# Patient Record
Sex: Female | Born: 1990 | Race: White | Hispanic: No | Marital: Married | Smoking: Never smoker
Health system: Southern US, Community
[De-identification: ages and names within clinical notes are randomized; demographics above are authoritative.]

## PROBLEM LIST (undated history)

## (undated) DIAGNOSIS — R51 Headache: Secondary | ICD-10-CM

## (undated) DIAGNOSIS — I10 Essential (primary) hypertension: Secondary | ICD-10-CM

## (undated) DIAGNOSIS — K219 Gastro-esophageal reflux disease without esophagitis: Secondary | ICD-10-CM

## (undated) DIAGNOSIS — E785 Hyperlipidemia, unspecified: Secondary | ICD-10-CM

## (undated) DIAGNOSIS — R519 Headache, unspecified: Secondary | ICD-10-CM

---
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
## 2008-08-01 ENCOUNTER — Emergency Department: Payer: Self-pay | Admitting: Internal Medicine

## 2011-02-05 ENCOUNTER — Emergency Department: Payer: Self-pay | Admitting: Unknown Physician Specialty

## 2012-01-06 DIAGNOSIS — E785 Hyperlipidemia, unspecified: Secondary | ICD-10-CM | POA: Insufficient documentation

## 2012-02-04 DIAGNOSIS — F418 Other specified anxiety disorders: Secondary | ICD-10-CM | POA: Insufficient documentation

## 2012-02-04 HISTORY — DX: Other specified anxiety disorders: F41.8

## 2012-08-29 ENCOUNTER — Observation Stay: Payer: Self-pay | Admitting: Obstetrics & Gynecology

## 2012-08-29 LAB — URINALYSIS, COMPLETE
Glucose,UR: NEGATIVE mg/dL (ref 0–75)
Ketone: NEGATIVE
Nitrite: NEGATIVE
Ph: 7 (ref 4.5–8.0)
Protein: NEGATIVE
Specific Gravity: 1.005 (ref 1.003–1.030)
Squamous Epithelial: 33
WBC UR: 19 /HPF (ref 0–5)

## 2012-10-20 ENCOUNTER — Observation Stay: Payer: Self-pay

## 2012-10-22 ENCOUNTER — Observation Stay: Payer: Self-pay | Admitting: Obstetrics and Gynecology

## 2012-10-22 ENCOUNTER — Emergency Department: Payer: Self-pay | Admitting: Emergency Medicine

## 2012-10-22 LAB — COMPREHENSIVE METABOLIC PANEL
Anion Gap: 12 (ref 7–16)
Bilirubin,Total: 0.5 mg/dL (ref 0.2–1.0)
Calcium, Total: 8.7 mg/dL (ref 8.5–10.1)
Chloride: 106 mmol/L (ref 98–107)
Co2: 25 mmol/L (ref 21–32)
Creatinine: 0.5 mg/dL — ABNORMAL LOW (ref 0.60–1.30)
EGFR (African American): >60
EGFR (Non-African Amer.): >60
Osmolality: 280 (ref 275–301)
Potassium: 3.5 mmol/L (ref 3.5–5.1)
Sodium: 143 mmol/L (ref 136–145)

## 2012-10-22 LAB — CBC WITH DIFFERENTIAL/PLATELET
Basophil %: 0.2 %
Eosinophil #: 0.2 10*3/uL (ref 0.0–0.7)
HCT: 36.1 % (ref 35.0–47.0)
HGB: 12.7 g/dL (ref 12.0–16.0)
Lymphocyte #: 2.7 10*3/uL (ref 1.0–3.6)
Lymphocyte %: 13.2 %
MCH: 31.3 pg (ref 26.0–34.0)
MCHC: 35.1 g/dL (ref 32.0–36.0)
MCV: 89 fL (ref 80–100)
Monocyte #: 0.9 x10 3/mm (ref 0.2–0.9)
Neutrophil #: 16.4 10*3/uL — ABNORMAL HIGH (ref 1.4–6.5)
Neutrophil %: 81.2 %
RBC: 4.05 10*6/uL (ref 3.80–5.20)

## 2012-10-22 LAB — LIPASE, BLOOD: Lipase: 113 U/L (ref 73–393)

## 2012-10-22 LAB — PROTEIN / CREATININE RATIO, URINE: Protein, Random Urine: 11 mg/dL (ref 0–12)

## 2012-10-23 LAB — URINALYSIS, COMPLETE
Bilirubin,UR: NEGATIVE
Blood: NEGATIVE
Glucose,UR: NEGATIVE mg/dL (ref 0–75)
Ketone: NEGATIVE
Leukocyte Esterase: NEGATIVE
Ph: 8 (ref 4.5–8.0)
RBC,UR: 1 /HPF (ref 0–5)
Specific Gravity: 1.011 (ref 1.003–1.030)
Squamous Epithelial: 15
WBC UR: 3 /HPF (ref 0–5)

## 2012-11-20 ENCOUNTER — Inpatient Hospital Stay: Payer: Self-pay

## 2012-11-20 LAB — CBC WITH DIFFERENTIAL/PLATELET
Basophil #: 0 10*3/uL (ref 0.0–0.1)
Basophil %: 0.2 %
Eosinophil #: 0.1 10*3/uL (ref 0.0–0.7)
HCT: 36.8 % (ref 35.0–47.0)
HGB: 12.2 g/dL (ref 12.0–16.0)
Lymphocyte #: 3.3 10*3/uL (ref 1.0–3.6)
MCH: 29.9 pg (ref 26.0–34.0)
MCHC: 33.3 g/dL (ref 32.0–36.0)
MCV: 90 fL (ref 80–100)
Monocyte %: 6.3 %
Neutrophil #: 14.3 10*3/uL — ABNORMAL HIGH (ref 1.4–6.5)
Platelet: 322 10*3/uL (ref 150–440)
RDW: 13.1 % (ref 11.5–14.5)
WBC: 18.9 10*3/uL — ABNORMAL HIGH (ref 3.6–11.0)

## 2012-11-23 LAB — HEMATOCRIT: HCT: 34.7 % — ABNORMAL LOW (ref 35.0–47.0)

## 2013-02-15 ENCOUNTER — Ambulatory Visit: Payer: Self-pay | Admitting: Family Medicine

## 2013-03-25 DIAGNOSIS — L709 Acne, unspecified: Secondary | ICD-10-CM | POA: Insufficient documentation

## 2013-03-25 HISTORY — DX: Acne, unspecified: L70.9

## 2015-04-24 NOTE — H&P (Signed)
L&D Evaluation:  History:   HPI 24yo G2P1001 at 28wks presents with c/o lower abd and low back pain for the past 3 days.  Pain is worse with movement and repositioning in bed.  Better with rest.  Denies contractions/cramping, LOF, VB.  +FM.  [facility]    Patient's Medical History No Chronic Illness    Patient's Surgical History none    Medications Pre Natal Vitamins    Allergies NKDA    Social History none    Family History Non-Contributory   ROS:   ROS All systems were reviewed.  HEENT, CNS, GI, GU, Respiratory, CV, Renal and Musculoskeletal systems were found to be normal.   Exam:   Vital Signs stable    General no apparent distress    Mental Status clear    Chest nl effort    Abdomen gravid, non-tender    Estimated Fetal Weight Average for gestational age    Back no CVAT    Edema no edema    Pelvic no external lesions, cervix closed and thick    Mebranes Intact    FHT normal rate with no decels    Ucx absent    Other UA WNL   Impression:   Impression 28wks with discomforts of pregnancy and round ligament pain   Plan:   Plan discharge home, pt reassured    Follow Up Appointment tomorrow as previously scheduled   Electronic Signatures: Garnette GunnerStansbury Clipp, Ali LoweEryn K (MD)  (Signed 15-Sep-13 17:04)  Authored: L&D Evaluation   Last Updated: 15-Sep-13 17:04 by Garnette GunnerStansbury Clipp, Ali LoweEryn K (MD)

## 2015-04-24 NOTE — H&P (Signed)
L&D Evaluation:  History Expanded:   HPI 24 yo who presents with diarrhea and severe pain through to the back and tender to palpation in the midline. she is having some n/v and started 2 days ago and came here and got T3 and she was sent home, yesterday n/v and D and today she had pain around 4pm and she ate something and it got worse, and got bacd around 6 pm and she decieded to come in. no blood in n/v    Gravida 2    Term 1    PreTerm 0    Abortion 0    Living 1    Maternal HIV Negative    Maternal Syphilis Ab Nonreactive    Maternal Varicella Immune    Rubella Results (Maternal) equivocal    Maternal T-Dap Nonimmune    [facility]    Presents with abdominal pain, back pain, nausea/vomiting    Patient's Medical History No Chronic Illness    Patient's Surgical History none    Medications Pre Natal Vitamins    Allergies NKDA    Social History none    Family History Non-Contributory   ROS:   ROS All systems were reviewed.  HEENT, CNS, GI, GU, Respiratory, CV, Renal and Musculoskeletal systems were found to be normal.   Exam:   Vital Signs stable    Urine Protein not completed    General no apparent distress    Mental Status clear    Chest clear    Heart normal sinus rhythm    Abdomen gravid, non-tender    Back no CVAT    Pelvic no external lesions    Mebranes Intact    FHT normal rate with no decels, strictly reactive no decels    Ucx absent   Plan:   Comments pt is unable to stay still, she is in pain and is writhin, fetus looks good and she is probabl;y having pancreatitis or gall bladder atack, will start ivf and place NPO.    Follow Up Appointment need to schedule   Electronic Signatures: Adria DevonKlett, Shereen Marton (MD)  (Signed [phone]-Nov-13 21:44)  Authored: L&D Evaluation   Last Updated: 08-Nov-13 21:44 by Adria DevonKlett, Kymberlie Brazeau (MD)

## 2015-04-24 NOTE — H&P (Signed)
L&D Evaluation:  History Expanded:   HPI 24 yo G2P1 with EDD of 11/18/12, presents at 36 weeks with c/o RUQ and epigastric pain that has radiated from her back. Also reports occasional contractions. Denies LOF or VB. +FM. [facility]    Blood Type (Maternal) O positive    Group B Strep Results Maternal (Result >5wks must be treated as unknown) unknown/result > 5 weeks ago    Maternal HIV Negative    Maternal Syphilis Ab Nonreactive    Maternal Varicella Immune    Rubella Results (Maternal) equivocal    Patient's Medical History Anxiety    Patient's Surgical History none    Medications Pre Natal Vitamins    Allergies NKDA    Social History none    Family History Non-Contributory   ROS:   ROS All systems were reviewed.  HEENT, CNS, GI, GU, Respiratory, CV, Renal and Musculoskeletal systems were found to be normal.   Exam:   Vital Signs stable    General no apparent distress    Mental Status clear    Chest clear    Heart no murmur/gallop/rubs    Abdomen gravid, non-tender    Estimated Fetal Weight Average for gestational age    Back no CVAT    Edema no edema    Pelvic no external lesions, cervix closed and thick    Mebranes Intact    FHT normal rate with no decels    Ucx irregular   Impression:   Impression 36 weeks with abdominal pain   Plan:   Comments Given simethicone and Zantac without relief of symptoms Will try Tylenol #3 for pain. Suspect discomfort is r/t fetal position and discomforts of pregnancy    Follow Up Appointment already scheduled   Electronic Signatures: Vella KohlerBrothers, Avie Checo K (CNM)  (Signed 07-Nov-13 01:48)  Authored: L&D Evaluation   Last Updated: 07-Nov-13 01:48 by Vella KohlerBrothers, Dorlene Footman K (CNM)

## 2015-09-17 DIAGNOSIS — K219 Gastro-esophageal reflux disease without esophagitis: Secondary | ICD-10-CM | POA: Insufficient documentation

## 2015-12-20 ENCOUNTER — Encounter: Payer: Self-pay | Admitting: *Deleted

## 2015-12-21 ENCOUNTER — Ambulatory Visit: Payer: Medicaid Other | Admitting: Registered Nurse

## 2015-12-21 ENCOUNTER — Ambulatory Visit
Admission: RE | Admit: 2015-12-21 | Discharge: 2015-12-21 | Disposition: A | Discharge status: Home / Self Care | Payer: Medicaid Other | Source: Ambulatory Visit | Attending: Gastroenterology | Admitting: Gastroenterology

## 2015-12-21 ENCOUNTER — Encounter
Admission: RE | Disposition: A | Discharge status: Home / Self Care | Payer: Self-pay | Source: Ambulatory Visit | Attending: Gastroenterology

## 2015-12-21 DIAGNOSIS — E785 Hyperlipidemia, unspecified: Secondary | ICD-10-CM | POA: Insufficient documentation

## 2015-12-21 DIAGNOSIS — G43909 Migraine, unspecified, not intractable, without status migrainosus: Secondary | ICD-10-CM | POA: Diagnosis not present

## 2015-12-21 DIAGNOSIS — R1013 Epigastric pain: Secondary | ICD-10-CM | POA: Diagnosis present

## 2015-12-21 DIAGNOSIS — K2950 Unspecified chronic gastritis without bleeding: Secondary | ICD-10-CM | POA: Insufficient documentation

## 2015-12-21 DIAGNOSIS — K219 Gastro-esophageal reflux disease without esophagitis: Secondary | ICD-10-CM | POA: Insufficient documentation

## 2015-12-21 HISTORY — DX: Headache, unspecified: R51.9

## 2015-12-21 HISTORY — DX: Gastro-esophageal reflux disease without esophagitis: K21.9

## 2015-12-21 HISTORY — DX: Hyperlipidemia, unspecified: E78.5

## 2015-12-21 HISTORY — DX: Headache: R51

## 2015-12-21 HISTORY — PX: ESOPHAGOGASTRODUODENOSCOPY (EGD) WITH PROPOFOL: SHX5813

## 2015-12-21 SURGERY — ESOPHAGOGASTRODUODENOSCOPY (EGD) WITH PROPOFOL
Anesthesia: General

## 2015-12-21 MED ORDER — SODIUM CHLORIDE 0.9 % IV SOLN: INTRAVENOUS | Status: DC

## 2015-12-21 MED ORDER — FENTANYL CITRATE (PF) 100 MCG/2ML IJ SOLN
INTRAMUSCULAR | Status: DC | PRN
  Administered 2015-12-21: 25 ug via INTRAVENOUS
  Administered 2015-12-21: 50 ug via INTRAVENOUS

## 2015-12-21 MED ORDER — GLYCOPYRROLATE 0.2 MG/ML IJ SOLN
INTRAMUSCULAR | Status: DC | PRN
  Administered 2015-12-21: 0.2 mg via INTRAVENOUS

## 2015-12-21 MED ORDER — MIDAZOLAM HCL 2 MG/2ML IJ SOLN
INTRAMUSCULAR | Status: DC | PRN
  Administered 2015-12-21 (×2): 1 mg via INTRAVENOUS

## 2015-12-21 MED ORDER — PROPOFOL 500 MG/50ML IV EMUL
INTRAVENOUS | Status: DC | PRN
  Administered 2015-12-21: 180 ug/kg/min via INTRAVENOUS

## 2015-12-21 MED ORDER — SODIUM CHLORIDE 0.9 % IV SOLN
INTRAVENOUS | Status: DC
  Administered 2015-12-21: 09:00:00 via INTRAVENOUS

## 2015-12-21 MED ORDER — LIDOCAINE HCL (CARDIAC) 20 MG/ML IV SOLN
INTRAVENOUS | Status: DC | PRN
  Administered 2015-12-21: 40 mg via INTRAVENOUS

## 2015-12-21 MED ORDER — PROPOFOL 10 MG/ML IV BOLUS
INTRAVENOUS | Status: DC | PRN
  Administered 2015-12-21: 50 mg via INTRAVENOUS

## 2015-12-21 NOTE — Transfer of Care (Deleted)
Immediate Anesthesia Transfer of Care Note  Patient: Casey Hansen  Procedure(s) Performed: Procedure(s): ESOPHAGOGASTRODUODENOSCOPY (EGD) WITH PROPOFOL (N/A)  Patient Location: PACU and Endoscopy Unit  Anesthesia Type:General  Level of Consciousness: sedated  Airway & Oxygen Therapy: Patient Spontanous Breathing and Patient connected to nasal cannula oxygen  Post-op Assessment: Report given to RN and Post -op Vital signs reviewed and stable  Post vital signs: Reviewed and stable  Last Vitals:  Filed Vitals:   12/21/15 0940 12/21/15 0950  BP: 104/68 97/52  Pulse: 85 76  Temp: 36 C   Resp: 16 14    Complications: No apparent anesthesia complications

## 2015-12-21 NOTE — Anesthesia Preprocedure Evaluation (Signed)
Anesthesia Evaluation  Patient identified by MRN, date of birth, ID band Patient awake    Reviewed: Allergy & Precautions, NPO status , Patient's Chart, lab work & pertinent test results  Airway Mallampati: I       Dental no notable dental hx.    Pulmonary neg pulmonary ROS,    Pulmonary exam normal        Cardiovascular negative cardio ROS   Rhythm:Regular     Neuro/Psych    GI/Hepatic Neg liver ROS, GERD  ,  Endo/Other  negative endocrine ROS  Renal/GU negative Renal ROS     Musculoskeletal negative musculoskeletal ROS (+)   Abdominal Normal abdominal exam  (+)   Peds negative pediatric ROS (+)  Hematology negative hematology ROS (+)   Anesthesia Other Findings   Reproductive/Obstetrics                             Anesthesia Physical Anesthesia Plan  ASA: I  Anesthesia Plan: General   Post-op Pain Management:    Induction: Intravenous  Airway Management Planned: Nasal Cannula  Additional Equipment:   Intra-op Plan:   Post-operative Plan:   Informed Consent: I have reviewed the patients History and Physical, chart, labs and discussed the procedure including the risks, benefits and alternatives for the proposed anesthesia with the patient or authorized representative who has indicated his/her understanding and acceptance.     Plan Discussed with: CRNA  Anesthesia Plan Comments:         Anesthesia Quick Evaluation

## 2015-12-21 NOTE — Op Note (Signed)
[facility] Gastroenterology Patient Name: Casey Hansen Procedure Date: 12/21/2015 9:36 AM MRN: [id number] Account #: [id number] Date of Birth: May 01, 1991 Admit Type: Outpatient Age: 25 Room: [facility] ENDO ROOM 4 Gender: Female Note Status: Finalized Procedure:         Upper GI endoscopy Indications:       Epigastric abdominal pain, Suspected esophageal reflux Providers:         Ezzard Standing. Bluford Kaufmann, MD Referring MD:      Resa Miner Marcelle Overlie (Referring MD) Medicines:         Monitored Anesthesia Care Complications:     No immediate complications. Procedure:         Pre-Anesthesia Assessment:                    - Prior to the procedure, a History and Physical was                     performed, and patient medications, allergies and                     sensitivities were reviewed. The patient's tolerance of                     previous anesthesia was reviewed.                    - The risks and benefits of the procedure and the sedation                     options and risks were discussed with the patient. All                     questions were answered and informed consent was obtained.                    - After reviewing the risks and benefits, the patient was                     deemed in satisfactory condition to undergo the procedure.                    After obtaining informed consent, the endoscope was passed                     under direct vision. Throughout the procedure, the                     patient's blood pressure, pulse, and oxygen saturations                     were monitored continuously. The Endoscope was introduced                     through the mouth, and advanced to the second part of                     duodenum. The upper GI endoscopy was accomplished without                     difficulty. The patient tolerated the procedure well. Findings:      The examined esophagus was normal. Biopsies were taken with a cold       forceps for histology.      The  entire examined stomach was normal.  Biopsies were taken with a cold       forceps for Helicobacter pylori testing.      The examined duodenum was normal. Impression:        - Normal esophagus. Biopsied.                    - Normal stomach. Biopsied.                    - Normal examined duodenum. Recommendation:    - Discharge patient to home.                    - Observe patient's clinical course.                    - Continue present medications.                    - Await pathology results.                    - The findings and recommendations were discussed with the                     patient. Procedure Code(s): --- Professional ---                    [phone], Esophagogastroduodenoscopy, flexible, transoral;                     with biopsy, single or multiple Diagnosis Code(s): --- Professional ---                    R10.13, Epigastric pain CPT copyright 2014 American Medical Association. All rights reserved. The codes documented in this report are preliminary and upon coder review may  be revised to meet current compliance requirements. Wallace CullensPaul Y Avamae Dehaan, MD 12/21/2015 9:44:29 AM This report has been signed electronically. Number of Addenda: 0 Note Initiated On: 12/21/2015 9:36 AM      [facility]

## 2015-12-21 NOTE — H&P (Signed)
    Primary Care Physician:  Imelda PillowHOLLAND, CHELSA, NP Primary Gastroenterologist:  Dr. Bluford Kaufmannh  Pre-Procedure History & Physical: HPI:  Casey Hansen is a 25 y.o. female is here for an EGD   Past Medical History  Diagnosis Date  . GERD (gastroesophageal reflux disease)   . Headache     Migraines  . Hyperlipidemia     Past Surgical History  Procedure Laterality Date  . No past surgeries      Prior to Admission medications   Medication Sig Start Date End Date Taking? Authorizing Provider  dicyclomine (BENTYL) 10 MG capsule Take 10 mg by mouth 4 (four) times daily -  before meals and at bedtime.   Yes Historical Provider, MD  norgestimate-ethinyl estradiol (ORTHO-CYCLEN,SPRINTEC,PREVIFEM) 0.25-35 MG-MCG tablet Take 1 tablet by mouth daily.   Yes Historical Provider, MD  Olopatadine HCl (PATADAY) 0.2 % SOLN Apply 1 drop to eye as needed (1 drop into both eyes).   Yes Historical Provider, MD  omeprazole (PRILOSEC) 20 MG capsule Take 20 mg by mouth daily.   Yes Historical Provider, MD  pantoprazole (PROTONIX) 40 MG tablet Take 40 mg by mouth daily.   Yes Historical Provider, MD  phentermine (ADIPEX-P) 37.5 MG tablet Take 37.5 mg by mouth daily before breakfast.   Yes Historical Provider, MD  simvastatin (ZOCOR) 20 MG tablet Take 20 mg by mouth daily.   Yes Historical Provider, MD  SUMAtriptan (IMITREX) 25 MG tablet Take 25 mg by mouth every 2 (two) hours as needed for migraine. May repeat in 2 hours if headache persists or recurs.   Yes Historical Provider, MD    Allergies as of 11/27/2015  . (Not on File)    History reviewed. No pertinent family history.  Social History   Social History  . Marital Status: Married    Spouse Name: N/A  . Number of Children: N/A  . Years of Education: N/A   Occupational History  . Not on file.   Social History Main Topics  . Smoking status: Never Smoker   . Smokeless tobacco: Not on file  . Alcohol Use: No  . Drug Use: No  . Sexual Activity: Not  on file   Other Topics Concern  . Not on file   Social History Narrative  . No narrative on file    Review of Systems: See HPI, otherwise negative ROS  Physical Exam: BP 160/101 mmHg  Pulse 89  Temp(Src) 97.2 F (36.2 C) (Tympanic)  Resp 19  Ht 5\' 5"  (1.651 m)  Wt 91.627 kg (202 lb)  BMI 33.61 kg/m2  SpO2 100% General:   Alert,  pleasant and cooperative in NAD Head:  Normocephalic and atraumatic. Neck:  Supple; no masses or thyromegaly. Lungs:  Clear throughout to auscultation.    Heart:  Regular rate and rhythm. Abdomen:  Soft, nontender and nondistended. Normal bowel sounds, without guarding, and without rebound.   Neurologic:  Alert and  oriented x4;  grossly normal neurologically.  Impression/Plan: Casey Hansen is here for an EGD to be performed for epigastric pain, GERD  Risks, benefits, limitations, and alternatives regarding EGD have been reviewed with the patient.  Questions have been answered.  All parties agreeable.   Esaul Dorwart, Ezzard StandingPAUL Y, MD  12/21/2015, 9:34 AM

## 2015-12-21 NOTE — Transfer of Care (Signed)
Immediate Anesthesia Transfer of Care Note  Patient: Casey Hansen L Graybill  Procedure(s) Performed: Procedure(s): ESOPHAGOGASTRODUODENOSCOPY (EGD) WITH PROPOFOL (N/A)  Patient Location: PACU and Endoscopy Unit  Anesthesia Type:General  Level of Consciousness: sedated  Airway & Oxygen Therapy: Patient Spontanous Breathing and Patient connected to nasal cannula oxygen  Post-op Assessment: Report given to RN and Post -op Vital signs reviewed and stable  Post vital signs: Reviewed and stable  Last Vitals:  Filed Vitals:   12/21/15 1000 12/21/15 1007  BP: 112/64 115/60  Pulse: 75 76  Temp:  36 C  Resp: 12 16    Complications: No apparent anesthesia complications

## 2015-12-21 NOTE — Anesthesia Procedure Notes (Signed)
Date/Time: 12/21/2015 9:35 AM Performed by: Stormy FabianURTIS, Carolyn Maniscalco Pre-anesthesia Checklist: Patient identified, Emergency Drugs available, Suction available and Patient being monitored Patient Re-evaluated:Patient Re-evaluated prior to inductionOxygen Delivery Method: Nasal cannula Intubation Type: IV induction Dental Injury: Teeth and Oropharynx as per pre-operative assessment  Comments: Nasal cannula with etCO2 monitoring

## 2015-12-21 NOTE — Anesthesia Postprocedure Evaluation (Signed)
Anesthesia Post Note  Patient: Felix Pacinimanda L Partridge  Procedure(s) Performed: Procedure(s) (LRB): ESOPHAGOGASTRODUODENOSCOPY (EGD) WITH PROPOFOL (N/A)  Patient location during evaluation: PACU Anesthesia Type: General Level of consciousness: awake Pain management: pain level controlled Vital Signs Assessment: post-procedure vital signs reviewed and stable Respiratory status: spontaneous breathing Cardiovascular status: stable Anesthetic complications: no    Last Vitals:  Filed Vitals:   12/21/15 1000 12/21/15 1007  BP: 112/64 115/60  Pulse: 75 76  Temp:  36 C  Resp: 12 16    Last Pain: There were no vitals filed for this visit.               VAN STAVEREN,Tauni Sanks

## 2015-12-24 LAB — SURGICAL PATHOLOGY

## 2015-12-25 NOTE — Addendum Note (Signed)
Addendum  created 12/25/15 1542 by Stormy FabianLinda Jiselle Sheu, CRNA   Modules edited: Anesthesia Responsible Staff

## 2015-12-27 ENCOUNTER — Other Ambulatory Visit: Payer: Self-pay | Admitting: Gastroenterology

## 2015-12-27 ENCOUNTER — Encounter: Payer: Self-pay | Admitting: Gastroenterology

## 2015-12-27 DIAGNOSIS — R110 Nausea: Secondary | ICD-10-CM

## 2015-12-27 DIAGNOSIS — R1013 Epigastric pain: Secondary | ICD-10-CM

## 2015-12-31 ENCOUNTER — Other Ambulatory Visit: Payer: Self-pay | Admitting: Gastroenterology

## 2015-12-31 DIAGNOSIS — R110 Nausea: Secondary | ICD-10-CM

## 2015-12-31 DIAGNOSIS — R1013 Epigastric pain: Secondary | ICD-10-CM

## 2016-01-01 ENCOUNTER — Other Ambulatory Visit: Payer: Self-pay | Admitting: Gastroenterology

## 2016-01-01 DIAGNOSIS — R1013 Epigastric pain: Secondary | ICD-10-CM

## 2016-01-01 DIAGNOSIS — R110 Nausea: Secondary | ICD-10-CM

## 2016-01-04 ENCOUNTER — Ambulatory Visit: Payer: Medicaid Other

## 2016-01-08 ENCOUNTER — Ambulatory Visit: Payer: Medicaid Other

## 2016-01-11 ENCOUNTER — Ambulatory Visit: Admission: RE | Admit: 2016-01-11 | Payer: Medicaid Other | Source: Ambulatory Visit

## 2016-01-11 ENCOUNTER — Ambulatory Visit: Payer: Medicaid Other

## 2016-01-21 ENCOUNTER — Ambulatory Visit: Admission: RE | Admit: 2016-01-21 | Payer: Medicaid Other | Source: Ambulatory Visit

## 2016-04-21 ENCOUNTER — Emergency Department (EMERGENCY_DEPARTMENT_HOSPITAL)
Admission: EM | Admit: 2016-04-21 | Discharge: 2016-04-21 | Disposition: A | Discharge status: Home / Self Care | Payer: Medicaid Other | Source: Home / Self Care | Attending: Emergency Medicine | Admitting: Emergency Medicine

## 2016-04-21 ENCOUNTER — Emergency Department: Payer: Medicaid Other

## 2016-04-21 ENCOUNTER — Encounter: Payer: Self-pay | Admitting: Emergency Medicine

## 2016-04-21 DIAGNOSIS — K8050 Calculus of bile duct without cholangitis or cholecystitis without obstruction: Secondary | ICD-10-CM

## 2016-04-21 DIAGNOSIS — E785 Hyperlipidemia, unspecified: Secondary | ICD-10-CM | POA: Insufficient documentation

## 2016-04-21 DIAGNOSIS — K8040 Calculus of bile duct with cholecystitis, unspecified, without obstruction: Secondary | ICD-10-CM

## 2016-04-21 DIAGNOSIS — K921 Melena: Secondary | ICD-10-CM | POA: Insufficient documentation

## 2016-04-21 DIAGNOSIS — K8042 Calculus of bile duct with acute cholecystitis without obstruction: Secondary | ICD-10-CM | POA: Insufficient documentation

## 2016-04-21 LAB — COMPREHENSIVE METABOLIC PANEL
ALT: 101 U/L — ABNORMAL HIGH (ref 14–54)
AST: 274 U/L — ABNORMAL HIGH (ref 15–41)
Albumin: 4.3 g/dL (ref 3.5–5.0)
Alkaline Phosphatase: 153 U/L — ABNORMAL HIGH (ref 38–126)
Anion gap: 11 (ref 5–15)
BUN: 10 mg/dL (ref 6–20)
CO2: 25 mmol/L (ref 22–32)
CREATININE: 0.64 mg/dL (ref 0.44–1.00)
Calcium: 9.4 mg/dL (ref 8.9–10.3)
Chloride: 102 mmol/L (ref 101–111)
GFR calc non Af Amer: >60 mL/min (ref >60)
Glucose, Bld: 95 mg/dL (ref 65–99)
Potassium: 3.4 mmol/L — ABNORMAL LOW (ref 3.5–5.1)
SODIUM: 138 mmol/L (ref 135–145)
TOTAL PROTEIN: 7.8 g/dL (ref 6.5–8.1)
Total Bilirubin: 0.6 mg/dL (ref 0.3–1.2)

## 2016-04-21 LAB — CBC
HCT: 44.4 % (ref 35.0–47.0)
Hemoglobin: 14.9 g/dL (ref 12.0–16.0)
MCH: 28.2 pg (ref 26.0–34.0)
MCHC: 33.5 g/dL (ref 32.0–36.0)
MCV: 84.3 fL (ref 80.0–100.0)
PLATELETS: 312 10*3/uL (ref 150–440)
RBC: 5.27 MIL/uL — ABNORMAL HIGH (ref 3.80–5.20)
RDW: 13.2 % (ref 11.5–14.5)
WBC: 11.9 10*3/uL — AB (ref 3.6–11.0)

## 2016-04-21 LAB — URINALYSIS COMPLETE WITH MICROSCOPIC (ARMC ONLY)
BILIRUBIN URINE: NEGATIVE
Bacteria, UA: NONE SEEN
GLUCOSE, UA: NEGATIVE mg/dL
HGB URINE DIPSTICK: NEGATIVE
Ketones, ur: NEGATIVE mg/dL
LEUKOCYTES UA: NEGATIVE
NITRITE: NEGATIVE
Protein, ur: NEGATIVE mg/dL
SPECIFIC GRAVITY, URINE: 1.019 (ref 1.005–1.030)
pH: 6 (ref 5.0–8.0)

## 2016-04-21 LAB — POCT PREGNANCY, URINE: Preg Test, Ur: NEGATIVE

## 2016-04-21 LAB — LIPASE, BLOOD: Lipase: 19 U/L (ref 11–51)

## 2016-04-21 MED ORDER — ONDANSETRON 4 MG PO TBDP
4.0000 mg | ORAL_TABLET | Freq: Three times a day (TID) | ORAL | Status: DC | PRN
Start: 2016-04-21 — End: 2016-04-23

## 2016-04-21 MED ORDER — OXYCODONE-ACETAMINOPHEN 5-325 MG PO TABS: 1.0000 | ORAL_TABLET | Freq: Four times a day (QID) | ORAL | Status: DC | PRN

## 2016-04-21 MED ORDER — MORPHINE SULFATE (PF) 4 MG/ML IV SOLN
4.0000 mg | Freq: Once | INTRAVENOUS | Status: DC
  Filled 2016-04-21: qty 1

## 2016-04-21 MED ORDER — ONDANSETRON HCL 4 MG/2ML IJ SOLN
4.0000 mg | Freq: Once | INTRAMUSCULAR | Status: AC
  Administered 2016-04-21: 4 mg via INTRAVENOUS
  Filled 2016-04-21: qty 2

## 2016-04-21 NOTE — ED Provider Notes (Signed)
[facility] Emergency Department Provider Note  Time seen: 6:04 PM  I have reviewed the triage vital signs and the nursing notes.   HISTORY  Chief Complaint Abdominal Pain    HPI Casey Hansen is a 25 y.o. female with a past medical history gastric reflux, presents the emergency department with right quadrant abdominal pain. According to the patient for the past 3 days she has been expressing right upper quadrant abdominal pain. She states is worse anytime she eats, she has not eaten anything since yesterday due to the discomfort. States nausea, denies vomiting, denies fever, denies diarrhea. Denies dysuria. Patient describes the abdominal pain is moderate to severe dull achy pain in the right upper quadrant.The patient states she has had similar pains before, has been told it could be her gallbladder but is not seen anyone about it.     Past Medical History  Diagnosis Date  . GERD (gastroesophageal reflux disease)   . Headache     Migraines  . Hyperlipidemia     There are no active problems to display for this patient.   Past Surgical History  Procedure Laterality Date  . No past surgeries    . Esophagogastroduodenoscopy (egd) with propofol N/A 12/21/2015    Procedure: ESOPHAGOGASTRODUODENOSCOPY (EGD) WITH PROPOFOL;  Surgeon: Wallace Cullens, MD;  Location: [facility] ENDOSCOPY;  Service: Gastroenterology;  Laterality: N/A;    Current Outpatient Rx  Name  Route  Sig  Dispense  Refill  . dicyclomine (BENTYL) 10 MG capsule   Oral   Take 10 mg by mouth 4 (four) times daily -  before meals and at bedtime.         . norgestimate-ethinyl estradiol (ORTHO-CYCLEN,SPRINTEC,PREVIFEM) 0.25-35 MG-MCG tablet   Oral   Take 1 tablet by mouth daily.         . Olopatadine HCl (PATADAY) 0.2 % SOLN   Ophthalmic   Apply 1 drop to eye as needed (1 drop into both eyes).         Marland Kitchen omeprazole (PRILOSEC) 20 MG capsule   Oral   Take 20 mg by mouth daily.         .  pantoprazole (PROTONIX) 40 MG tablet   Oral   Take 40 mg by mouth daily.         . phentermine (ADIPEX-P) 37.5 MG tablet   Oral   Take 37.5 mg by mouth daily before breakfast.         . simvastatin (ZOCOR) 20 MG tablet   Oral   Take 20 mg by mouth daily.         . SUMAtriptan (IMITREX) 25 MG tablet   Oral   Take 25 mg by mouth every 2 (two) hours as needed for migraine. May repeat in 2 hours if headache persists or recurs.           Allergies Review of patient's allergies indicates no known allergies.  No family history on file.  Social History Social History  Substance Use Topics  . Smoking status: Never Smoker   . Smokeless tobacco: None  . Alcohol Use: No    Review of Systems Constitutional: Negative for fever. Cardiovascular: Negative for chest pain. Respiratory: Negative for shortness of breath. Gastrointestinal: Right upper quadrant pain. Positive for nausea. Negative for vomiting or diarrhea. Genitourinary: Negative for dysuria. Musculoskeletal: Negative for back pain. Neurological: Negative for headache 10-point ROS otherwise negative.  ____________________________________________   PHYSICAL EXAM:  VITAL SIGNS: ED Triage Vitals  Enc  Vitals Group     BP 04/21/16 1701 152/119 mmHg     Pulse Rate 04/21/16 1701 100     Resp 04/21/16 1701 18     Temp 04/21/16 1701 98.4 F (36.9 C)     Temp Source 04/21/16 1701 Oral     SpO2 04/21/16 1701 97 %     Weight 04/21/16 1701 200 lb (90.719 kg)     Height 04/21/16 1701 5\' 5"  (1.651 m)     Head Cir --      Peak Flow --      Pain Score 04/21/16 1702 9     Pain Loc --      Pain Edu? --      Excl. in GC? --     Constitutional: Alert and oriented. Well appearing and in no distress. Eyes: Normal exam ENT   Head: Normocephalic and atraumatic.   Mouth/Throat: Mucous membranes are moist. Cardiovascular: Normal rate, regular rhythm. No murmur Respiratory: Normal respiratory effort without  tachypnea nor retractions. Breath sounds are clear  Gastrointestinal: Soft, moderate right upper quadrant tenderness palpation without rebound or guarding. No distention. Musculoskeletal: Nontender with normal range of motion in all extremities.  Neurologic:  Normal speech and language. No gross focal neurologic deficits Skin:  Skin is warm, dry and intact.  Psychiatric: Mood and affect are normal.  ____________________________________________    EKG  EKG reviewed and interpreted by myself shows normal sinus rhythm at 77 bpm, narrow QRS, normal axis, normal intervals, no ST changes. Normal EKG.  ____________________________________________    RADIOLOGY  Ultrasound shows multiple gallstones, CBD of 7 mm.  ____________________________________________    INITIAL IMPRESSION / ASSESSMENT AND PLAN / ED COURSE  Pertinent labs & imaging results that were available during my care of the patient were reviewed by me and considered in my medical decision making (see chart for details).  Patient presents the emergency department 3 days of right upper quadrant abdominal discomfort worse when eating food. We'll check labs, obtain a right upper quadrant ultrasound, treat the patient's pain and nausea while awaiting results.  Ultrasound shows multiple gallstones. ALT is elevated. Patient continues to have right upper quadrant tenderness to palpation. Given elevated LFTs with ultrasound findings I discussed the patient with general surgery who will be down to chat with the patient.  Patient has been seen by general surgery who recommends admission to the hospital for cholecystectomy. Patient states she has children at home and she needs to go home tonight but she will call the office tomorrow. I discussed with the patient if the pain worsens at all or she develops a fever she needs to return to the emergency department immediately otherwise she will call general surgery in the morning. I discussed  with the patient that our recommendation is admission to the hospital for surgery, patient is aware but still wishes to go home.  ____________________________________________   FINAL CLINICAL IMPRESSION(S) / ED DIAGNOSES  Right upper quadrant pain Biliary colic  Minna AntisKevin Omya Winfield, MD 04/21/16 2137

## 2016-04-21 NOTE — Discharge Instructions (Signed)
You have been seen in the emergency department for abdominal pain. The abdominal pain is likely related to her gallbladder. As we discussed we recommend that your gallbladder be removed surgically as soon as possible. Please call the number provided for Dr. Excell Seltzerooper tomorrow morning as we discussed. If your pain worsens, you developed a fever, or vomiting please return as soon as possible to the emergency department.    Laparoscopic Cholecystectomy Laparoscopic cholecystectomy is surgery to remove the gallbladder. The gallbladder is located in the upper right part of the abdomen, behind the liver. It is a storage sac for bile, which is produced in the liver. Bile aids in the digestion and absorption of fats. Cholecystectomy is often done for inflammation of the gallbladder (cholecystitis). This condition is usually caused by a buildup of gallstones (cholelithiasis) in the gallbladder. Gallstones can block the flow of bile, and that can result in inflammation and pain. In severe cases, emergency surgery may be required. If emergency surgery is not required, you will have time to prepare for the procedure. Laparoscopic surgery is an alternative to open surgery. Laparoscopic surgery has a shorter recovery time. Your common bile duct may also need to be examined during the procedure. If stones are found in the common bile duct, they may be removed. LET [facility] PROVID[facility] KNOW ABOUT:  Any allergies you have.  All medicines you are taking, including vitamins, herbs, eye drops, creams, and over-the-counter medicines.  Previous problems you or members of your family have had with the use of anesthetics.  Any blood disorders you have.  Previous surgeries you have had.  Any medical conditions you have. RISKS AND COMPLICATIONS Generally, this is a safe procedure. However, problems may occur, including:  Infection.  Bleeding.  Allergic reactions to medicines.  Damage to other structures or  organs.  A stone remaining in the common bile duct.  A bile leak from the cyst duct that is clipped when your gallbladder is removed.  The need to convert to open surgery, which requires a larger incision in the abdomen. This may be necessary if your surgeon thinks that it is not safe to continue with a laparoscopic procedure. BEFORE THE PROCEDURE  Ask your health care provider about:  Changing or stopping your regular medicines. This is especially important if you are taking diabetes medicines or blood thinners.  Taking medicines such as aspirin and ibuprofen. These medicines can thin your blood. Do not take these medicines before your procedure if your health care provider instructs you not to.  Follow instructions from your health care provider about eating or drinking restrictions.  Let your health care provider know if you develop a cold or an infection before surgery.  Plan to have someone take you home after the procedure.  Ask your health care provider how your surgical site will be marked or identified.  You may be given antibiotic medicine to help prevent infection. PROCEDURE  To reduce your risk of infection:  Your health care team will wash or sanitize their hands.  Your skin will be washed with soap.  An IV tube may be inserted into one of your veins.  You will be given a medicine to make you fall asleep (general anesthetic).  A breathing tube will be placed in your mouth.  The surgeon will make several small cuts (incisions) in your abdomen.  A thin, lighted tube (laparoscope) that has a tiny camera on the end will be inserted through one of the small incisions. The  camera on the laparoscope will send a picture to a TV screen (monitor) in the operating room. This will give the surgeon a good view inside your abdomen.  A gas will be pumped into your abdomen. This will expand your abdomen to give the surgeon more room to perform the surgery.  Other tools that  are needed for the procedure will be inserted through the other incisions. The gallbladder will be removed through one of the incisions.  After your gallbladder has been removed, the incisions will be closed with stitches (sutures), staples, or skin glue.  Your incisions may be covered with a bandage (dressing). The procedure may vary among health care providers and hospitals. AFTER THE PROCEDURE  Your blood pressure, heart rate, breathing rate, and blood oxygen level will be monitored often until the medicines you were given have worn off.  You will be given medicines as needed to control your pain.   This information is not intended to replace advice given to you by your health care provider. Make sure you discuss any questions you have with your health care provider.   Document Released: 12/01/2005 Document Revised: 08/22/2015 Document Reviewed: 07/13/2013 Elsevier Interactive Patient Education 2016 [employer].  Cholelithiasis Cholelithiasis (also called gallstones) is a form of gallbladder disease. The gallbladder is a small organ that helps you digest fats. Symptoms of gallstones are:  Feeling sick to your stomach (nausea).  Throwing up (vomiting).  Belly pain.  Yellowing of the skin (jaundice).  Sudden pain. You may feel the pain for minutes to hours.  Fever.  Pain to the touch. HOME CARE  Only take medicines as told by your doctor.  Eat a low-fat diet until you see your doctor again. Eating fat can result in pain.  Follow up with your doctor as told. Attacks usually happen time after time. Surgery is usually needed for permanent treatment. GET HELP RIGHT AWAY IF:   Your pain gets worse.  Your pain is not helped by medicines.  You have a fever and lasting symptoms for more than 2-3 days.  You have a fever and your symptoms suddenly get worse.  You keep feeling sick to your stomach and throwing up. MAKE SURE YOU:   Understand these instructions.  Will  watch your condition.  Will get help right away if you are not doing well or get worse.   This information is not intended to replace advice given to you by your health care provider. Make sure you discuss any questions you have with your health care provider.   Document Released: 05/19/2008 Document Revised: 08/03/2013 Document Reviewed: 05/25/2013 Elsevier Interactive Patient Education [employer]

## 2016-04-21 NOTE — Consult Note (Signed)
Surgical Consultation  04/21/2016  Casey Hansen is an 25 y.o. female.   CC: Right upper quadrant pain  HPI: This patient with 3 days of right upper quadrant pain with nausea no vomiting no jaundice or acholic stools no fevers or chills but she has had multiple episodes of this dating back to when she was pregnant with her now 51-year-old son. I was asked see the patient with elevated liver function tests and a dilated bile duct on ultrasound all suggestive of choledocholithiasis. Her pain is been unrelenting and points to the right upper quadrant  Past Medical History  Diagnosis Date  . GERD (gastroesophageal reflux disease)   . Headache     Migraines  . Hyperlipidemia     Past Surgical History  Procedure Laterality Date  . No past surgeries    . Esophagogastroduodenoscopy (egd) with propofol N/A 12/21/2015    Procedure: ESOPHAGOGASTRODUODENOSCOPY (EGD) WITH PROPOFOL;  Surgeon: Hulen Luster, MD;  Location: [facility] ENDOSCOPY;  Service: Gastroenterology;  Laterality: N/A;    No family history on file.  Social History:  reports that she has never smoked. She does not have any smokeless tobacco history on file. She reports that she does not drink alcohol or use illicit drugs.  Allergies: No Known Allergies  Medications reviewed.   Review of Systems:   Review of Systems  Constitutional: Negative for fever and chills.  HENT: Negative.   Eyes: Negative.   Respiratory: Negative.   Cardiovascular: Negative.   Gastrointestinal: Positive for nausea, abdominal pain and melena. Negative for heartburn, vomiting, diarrhea, constipation and blood in stool.  Genitourinary: Negative.   Musculoskeletal: Negative.   Skin: Negative.   Neurological: Negative.   Endo/Heme/Allergies: Negative.   Psychiatric/Behavioral: Negative.      Physical Exam:  BP 152/119 mmHg  Pulse 79  Temp(Src) 98.4 F (36.9 C) (Oral)  Resp 16  Ht '5\' 5"'$  (1.651 m)  Wt 200 lb (90.719 kg)  BMI 33.28 kg/m2  SpO2 100%   LMP 04/14/2016  Physical Exam  Constitutional: She is oriented to person, place, and time and well-developed, well-nourished, and in no distress. No distress.  Appears quite comfortable no icterus no jaundice  HENT:  Head: Normocephalic and atraumatic.  Eyes: Pupils are equal, round, and reactive to light. Right eye exhibits no discharge. Left eye exhibits no discharge. No scleral icterus.  Neck: Normal range of motion.  Cardiovascular: Normal rate and regular rhythm.   Pulmonary/Chest: Effort normal and breath sounds normal. No respiratory distress. She has no wheezes. She has no rales.  Abdominal: Soft. She exhibits no distension. There is tenderness. There is no rebound and no guarding.  Tender in right upper quadrant with a questionable Murphy sign  Musculoskeletal: Normal range of motion. She exhibits no edema or tenderness.  Lymphadenopathy:    She has no cervical adenopathy.  Neurological: She is alert and oriented to person, place, and time.  Skin: Skin is warm and dry. She is not diaphoretic. No erythema.  Psychiatric: Mood and affect normal.  Vitals reviewed.     Results for orders placed or performed during the hospital encounter of 04/21/16 (from the past 48 hour(s))  Lipase, blood     Status: None   Collection Time: 04/21/16  5:09 PM  Result Value Ref Range   Lipase 19 11 - 51 U/L  Comprehensive metabolic panel     Status: Abnormal   Collection Time: 04/21/16  5:09 PM  Result Value Ref Range   Sodium 138 135 -  145 mmol/L   Potassium 3.4 (L) 3.5 - 5.1 mmol/L   Chloride 102 101 - 111 mmol/L   CO2 25 22 - 32 mmol/L   Glucose, Bld 95 65 - 99 mg/dL   BUN 10 6 - 20 mg/dL   Creatinine, Ser 0.64 0.44 - 1.00 mg/dL   Calcium 9.4 8.9 - 10.3 mg/dL   Total Protein 7.8 6.5 - 8.1 g/dL   Albumin 4.3 3.5 - 5.0 g/dL   AST 274 (H) 15 - 41 U/L   ALT 101 (H) 14 - 54 U/L   Alkaline Phosphatase 153 (H) 38 - 126 U/L   Total Bilirubin 0.6 0.3 - 1.2 mg/dL   GFR calc non Af Amer >60  >60 mL/min   GFR calc Af Amer >60 >60 mL/min    Comment: (NOTE) The eGFR has been calculated using the CKD EPI equation. This calculation has not been validated in all clinical situations. eGFR's persistently <60 mL/min signify possible Chronic Kidney Disease.    Anion gap 11 5 - 15  CBC     Status: Abnormal   Collection Time: 04/21/16  5:09 PM  Result Value Ref Range   WBC 11.9 (H) 3.6 - 11.0 K/uL   RBC 5.27 (H) 3.80 - 5.20 MIL/uL   Hemoglobin 14.9 12.0 - 16.0 g/dL   HCT 44.4 35.0 - 47.0 %   MCV 84.3 80.0 - 100.0 fL   MCH 28.2 26.0 - 34.0 pg   MCHC 33.5 32.0 - 36.0 g/dL   RDW 13.2 11.5 - 14.5 %   Platelets 312 150 - 440 K/uL  Urinalysis complete, with microscopic     Status: Abnormal   Collection Time: 04/21/16  5:09 PM  Result Value Ref Range   Color, Urine YELLOW (A) YELLOW   APPearance CLEAR (A) CLEAR   Glucose, UA NEGATIVE NEGATIVE mg/dL   Bilirubin Urine NEGATIVE NEGATIVE   Ketones, ur NEGATIVE NEGATIVE mg/dL   Specific Gravity, Urine 1.019 1.005 - 1.030   Hgb urine dipstick NEGATIVE NEGATIVE   pH 6.0 5.0 - 8.0   Protein, ur NEGATIVE NEGATIVE mg/dL   Nitrite NEGATIVE NEGATIVE   Leukocytes, UA NEGATIVE NEGATIVE   RBC / HPF 0-5 0 - 5 RBC/hpf   WBC, UA 0-5 0 - 5 WBC/hpf   Bacteria, UA NONE SEEN NONE SEEN   Squamous Epithelial / LPF 0-5 (A) NONE SEEN   Mucous PRESENT   Pregnancy, urine POC     Status: None   Collection Time: 04/21/16  5:18 PM  Result Value Ref Range   Preg Test, Ur NEGATIVE NEGATIVE    Comment:        THE SENSITIVITY OF THIS METHODOLOGY IS >24 mIU/mL    US Abdomen Limited Ruq  04/21/2016  CLINICAL DATA:  Right upper quadrant pain EXAM: US ABDOMEN LIMITED - RIGHT UPPER QUADRANT COMPARISON:  CT 02/15/2013 FINDINGS: Gallbladder: Multiple mobile gallstones noted within the gallbladder. No wall thickening. The largest stone 16 mm. No sonographic Murphy sign. Common bile duct: Diameter: Borderline in diameter at 7 mm. Liver: No focal lesion identified.  Within normal limits in parenchymal echogenicity. IMPRESSION: Cholelithiasis. Common bile duct is upper limits normal in diameter at 7 mm. No visible ductal stones, but if there is clinical concern, this could be further evaluated with MRCP or ERCP. Electronically Signed   By: Rolm Baptise M.D.   On: 04/21/2016 20:38    Assessment/Plan:  This a patient with right upper quadrant pain of a long-standing nature worsened over  the last 3 days now she has signs of choledocholithiasis with LFT elevation as well as a dilated common bile duct. I recommended admission to the hospital and instituting antibiotic therapy and rechecking LFTs in the morning to consider laparoscopic cholecystectomy with cholangiography at some point. She has 20 children 73-year-old 32-year-old at home and has no means of caring for them or ranging care at this point. With that social complication I discussed with her discharge from the emergency room with analgesics and anti-emetics with follow-up in our office to schedule surgery this week once she can arrange her childcare. She was in agreement with this but again I reviewed with her the need for hospitalization as the first choice and care here but she will not do that due to her social situation. This will be discussed with the emergency room physician once he is available.  Florene Glen, MD, FACS

## 2016-04-21 NOTE — ED Notes (Signed)
Stomach pain RUQ that began on Sat, worsens with eating. Vomiting as well. Pt states she feels it could be her gallbladder, tearful in triage.

## 2016-04-21 NOTE — ED Notes (Signed)
Pt. Going home by self, will follow up with Excell Seltzerooper tomorrow.

## 2016-04-22 ENCOUNTER — Inpatient Hospital Stay: Payer: Medicaid Other

## 2016-04-22 ENCOUNTER — Inpatient Hospital Stay: Payer: Medicaid Other | Admitting: Certified Registered"

## 2016-04-22 ENCOUNTER — Encounter
Admission: AD | Disposition: A | Discharge status: Home / Self Care | Payer: Self-pay | Source: Ambulatory Visit | Attending: Surgery

## 2016-04-22 ENCOUNTER — Inpatient Hospital Stay
Admission: AD | Admit: 2016-04-22 | Discharge: 2016-04-25 | DRG: 419 | Disposition: A | Discharge status: Home / Self Care | Payer: Medicaid Other | Source: Ambulatory Visit | Attending: Surgery | Admitting: Surgery

## 2016-04-22 DIAGNOSIS — K8050 Calculus of bile duct without cholangitis or cholecystitis without obstruction: Secondary | ICD-10-CM | POA: Insufficient documentation

## 2016-04-22 DIAGNOSIS — K8020 Calculus of gallbladder without cholecystitis without obstruction: Secondary | ICD-10-CM

## 2016-04-22 DIAGNOSIS — K8046 Calculus of bile duct with acute and chronic cholecystitis without obstruction: Secondary | ICD-10-CM | POA: Diagnosis present

## 2016-04-22 DIAGNOSIS — R7989 Other specified abnormal findings of blood chemistry: Secondary | ICD-10-CM | POA: Diagnosis present

## 2016-04-22 DIAGNOSIS — K828 Other specified diseases of gallbladder: Secondary | ICD-10-CM | POA: Diagnosis present

## 2016-04-22 DIAGNOSIS — K660 Peritoneal adhesions (postprocedural) (postinfection): Secondary | ICD-10-CM | POA: Diagnosis present

## 2016-04-22 DIAGNOSIS — K8042 Calculus of bile duct with acute cholecystitis without obstruction: Secondary | ICD-10-CM | POA: Diagnosis present

## 2016-04-22 DIAGNOSIS — K219 Gastro-esophageal reflux disease without esophagitis: Secondary | ICD-10-CM | POA: Diagnosis present

## 2016-04-22 DIAGNOSIS — K838 Other specified diseases of biliary tract: Secondary | ICD-10-CM | POA: Diagnosis present

## 2016-04-22 DIAGNOSIS — R932 Abnormal findings on diagnostic imaging of liver and biliary tract: Secondary | ICD-10-CM | POA: Insufficient documentation

## 2016-04-22 DIAGNOSIS — K8040 Calculus of bile duct with cholecystitis, unspecified, without obstruction: Secondary | ICD-10-CM

## 2016-04-22 HISTORY — PX: CHOLECYSTECTOMY: SHX55

## 2016-04-22 LAB — COMPREHENSIVE METABOLIC PANEL
ALBUMIN: 4.1 g/dL (ref 3.5–5.0)
ALT: 237 U/L — ABNORMAL HIGH (ref 14–54)
ANION GAP: 9 (ref 5–15)
AST: 276 U/L — ABNORMAL HIGH (ref 15–41)
Alkaline Phosphatase: 187 U/L — ABNORMAL HIGH (ref 38–126)
BUN: 9 mg/dL (ref 6–20)
CALCIUM: 9.5 mg/dL (ref 8.9–10.3)
CO2: 24 mmol/L (ref 22–32)
Chloride: 105 mmol/L (ref 101–111)
Creatinine, Ser: 0.62 mg/dL (ref 0.44–1.00)
GFR calc non Af Amer: >60 mL/min (ref >60)
GLUCOSE: 89 mg/dL (ref 65–99)
POTASSIUM: 3.8 mmol/L (ref 3.5–5.1)
SODIUM: 138 mmol/L (ref 135–145)
TOTAL PROTEIN: 8 g/dL (ref 6.5–8.1)
Total Bilirubin: 1.2 mg/dL (ref 0.3–1.2)

## 2016-04-22 LAB — CBC
HCT: 44.7 % (ref 35.0–47.0)
Hemoglobin: 15 g/dL (ref 12.0–16.0)
MCH: 28.6 pg (ref 26.0–34.0)
MCHC: 33.6 g/dL (ref 32.0–36.0)
MCV: 85.1 fL (ref 80.0–100.0)
Platelets: 307 10*3/uL (ref 150–440)
RBC: 5.25 MIL/uL — ABNORMAL HIGH (ref 3.80–5.20)
RDW: 13.5 % (ref 11.5–14.5)
WBC: 8 10*3/uL (ref 3.6–11.0)

## 2016-04-22 SURGERY — LAPAROSCOPIC CHOLECYSTECTOMY WITH INTRAOPERATIVE CHOLANGIOGRAM
Anesthesia: General | Wound class: Clean Contaminated

## 2016-04-22 MED ORDER — FENTANYL CITRATE (PF) 100 MCG/2ML IJ SOLN
INTRAMUSCULAR | Status: AC
  Filled 2016-04-22: qty 2

## 2016-04-22 MED ORDER — HEPARIN SODIUM (PORCINE) 5000 UNIT/ML IJ SOLN
5000.0000 [IU] | Freq: Three times a day (TID) | INTRAMUSCULAR | Status: DC
  Administered 2016-04-22 – 2016-04-25 (×7): 5000 [IU] via SUBCUTANEOUS
  Filled 2016-04-22 (×7): qty 1

## 2016-04-22 MED ORDER — PROPOFOL 10 MG/ML IV BOLUS
INTRAVENOUS | Status: DC | PRN
  Administered 2016-04-22: 170 mg via INTRAVENOUS

## 2016-04-22 MED ORDER — DEXTROSE 5 % IV SOLN
2.0000 g | INTRAVENOUS | Status: AC
  Administered 2016-04-22: 2 g via INTRAVENOUS
  Filled 2016-04-22: qty 2

## 2016-04-22 MED ORDER — MIDAZOLAM HCL 2 MG/2ML IJ SOLN
INTRAMUSCULAR | Status: DC | PRN
  Administered 2016-04-22: 2 mg via INTRAVENOUS

## 2016-04-22 MED ORDER — NEOSTIGMINE METHYLSULFATE 10 MG/10ML IV SOLN
INTRAVENOUS | Status: DC | PRN
  Administered 2016-04-22: 4 mg via INTRAVENOUS

## 2016-04-22 MED ORDER — ACETAMINOPHEN 10 MG/ML IV SOLN
INTRAVENOUS | Status: AC
  Filled 2016-04-22: qty 100

## 2016-04-22 MED ORDER — BUPIVACAINE-EPINEPHRINE (PF) 0.25% -1:200000 IJ SOLN
INTRAMUSCULAR | Status: AC
  Filled 2016-04-22: qty 30

## 2016-04-22 MED ORDER — METRONIDAZOLE IN NACL 5-0.79 MG/ML-% IV SOLN
500.0000 mg | INTRAVENOUS | Status: AC
  Administered 2016-04-22: 500 mg via INTRAVENOUS
  Filled 2016-04-22: qty 100

## 2016-04-22 MED ORDER — LIDOCAINE HCL (CARDIAC) 20 MG/ML IV SOLN
INTRAVENOUS | Status: DC | PRN
  Administered 2016-04-22: 90 mg via INTRAVENOUS

## 2016-04-22 MED ORDER — DEXTROSE IN LACTATED RINGERS 5 % IV SOLN
INTRAVENOUS | Status: DC
  Administered 2016-04-22: 23:00:00 via INTRAVENOUS
  Administered 2016-04-22: 1000 mL via INTRAVENOUS
  Administered 2016-04-23 – 2016-04-25 (×4): via INTRAVENOUS

## 2016-04-22 MED ORDER — BUPIVACAINE-EPINEPHRINE 0.25% -1:200000 IJ SOLN
INTRAMUSCULAR | Status: DC | PRN
  Administered 2016-04-22: 30 mL

## 2016-04-22 MED ORDER — KETOROLAC TROMETHAMINE 15 MG/ML IJ SOLN
15.0000 mg | Freq: Four times a day (QID) | INTRAMUSCULAR | Status: AC
  Filled 2016-04-22: qty 1

## 2016-04-22 MED ORDER — HYDROMORPHONE HCL 1 MG/ML IJ SOLN
0.5000 mg | INTRAMUSCULAR | Status: DC | PRN
  Administered 2016-04-24: 0.5 mg via INTRAVENOUS
  Filled 2016-04-22: qty 1

## 2016-04-22 MED ORDER — FENTANYL CITRATE (PF) 100 MCG/2ML IJ SOLN
25.0000 ug | INTRAMUSCULAR | Status: DC | PRN
  Administered 2016-04-22 (×3): 50 ug via INTRAVENOUS

## 2016-04-22 MED ORDER — ONDANSETRON HCL 4 MG/2ML IJ SOLN: 4.0000 mg | Freq: Once | INTRAMUSCULAR | Status: DC | PRN

## 2016-04-22 MED ORDER — ONDANSETRON HCL 4 MG/2ML IJ SOLN
4.0000 mg | Freq: Four times a day (QID) | INTRAMUSCULAR | Status: DC | PRN
  Administered 2016-04-22 – 2016-04-24 (×2): 4 mg via INTRAVENOUS
  Filled 2016-04-22 (×2): qty 2

## 2016-04-22 MED ORDER — DIPHENHYDRAMINE HCL 50 MG/ML IJ SOLN
25.0000 mg | Freq: Four times a day (QID) | INTRAMUSCULAR | Status: DC | PRN
  Filled 2016-04-22: qty 1

## 2016-04-22 MED ORDER — PANTOPRAZOLE SODIUM 40 MG IV SOLR
40.0000 mg | Freq: Every day | INTRAVENOUS | Status: DC
  Administered 2016-04-22 – 2016-04-24 (×3): 40 mg via INTRAVENOUS
  Filled 2016-04-22 (×3): qty 40

## 2016-04-22 MED ORDER — OXYCODONE-ACETAMINOPHEN 5-325 MG PO TABS
1.0000 | ORAL_TABLET | ORAL | Status: DC | PRN
  Administered 2016-04-22 – 2016-04-23 (×2): 1 via ORAL
  Administered 2016-04-23: 2 via ORAL
  Administered 2016-04-24 – 2016-04-25 (×3): 1 via ORAL
  Filled 2016-04-22 (×7): qty 1

## 2016-04-22 MED ORDER — KETOROLAC TROMETHAMINE 15 MG/ML IJ SOLN: 15.0000 mg | Freq: Four times a day (QID) | INTRAMUSCULAR | Status: DC | PRN

## 2016-04-22 MED ORDER — ONDANSETRON 8 MG PO TBDP: 4.0000 mg | ORAL_TABLET | Freq: Four times a day (QID) | ORAL | Status: DC | PRN

## 2016-04-22 MED ORDER — KETOROLAC TROMETHAMINE 30 MG/ML IJ SOLN
INTRAMUSCULAR | Status: DC | PRN
  Administered 2016-04-22: 30 mg via INTRAVENOUS

## 2016-04-22 MED ORDER — ROCURONIUM BROMIDE 100 MG/10ML IV SOLN
INTRAVENOUS | Status: DC | PRN
  Administered 2016-04-22: 10 mg via INTRAVENOUS
  Administered 2016-04-22: 30 mg via INTRAVENOUS
  Administered 2016-04-22: 10 mg via INTRAVENOUS

## 2016-04-22 MED ORDER — ONDANSETRON HCL 4 MG/2ML IJ SOLN
INTRAMUSCULAR | Status: DC | PRN
  Administered 2016-04-22: 4 mg via INTRAVENOUS

## 2016-04-22 MED ORDER — PIPERACILLIN-TAZOBACTAM 3.375 G IVPB
3.3750 g | Freq: Three times a day (TID) | INTRAVENOUS | Status: DC
  Administered 2016-04-22 – 2016-04-25 (×8): 3.375 g via INTRAVENOUS
  Filled 2016-04-22 (×10): qty 50

## 2016-04-22 MED ORDER — GLYCOPYRROLATE 0.2 MG/ML IJ SOLN
INTRAMUSCULAR | Status: DC | PRN
  Administered 2016-04-22: .6 mg via INTRAVENOUS

## 2016-04-22 MED ORDER — ACETAMINOPHEN 10 MG/ML IV SOLN
INTRAVENOUS | Status: DC | PRN
Start: 2016-04-22 — End: 2016-04-22
  Administered 2016-04-22: 1000 mg via INTRAVENOUS

## 2016-04-22 MED ORDER — DEXAMETHASONE SODIUM PHOSPHATE 10 MG/ML IJ SOLN
INTRAMUSCULAR | Status: DC | PRN
  Administered 2016-04-22: 10 mg via INTRAVENOUS

## 2016-04-22 MED ORDER — IOPAMIDOL (ISOVUE-300) INJECTION 61%
INTRAVENOUS | Status: DC | PRN
  Administered 2016-04-22: 25 mL

## 2016-04-22 MED ORDER — HYDROMORPHONE HCL 1 MG/ML IJ SOLN
INTRAMUSCULAR | Status: AC
  Filled 2016-04-22: qty 2

## 2016-04-22 MED ORDER — LACTATED RINGERS IV SOLN
INTRAVENOUS | Status: DC
  Administered 2016-04-22: 13:00:00 via INTRAVENOUS

## 2016-04-22 MED ORDER — PHENYLEPHRINE HCL 10 MG/ML IJ SOLN
INTRAMUSCULAR | Status: DC | PRN
  Administered 2016-04-22: 100 ug via INTRAVENOUS
  Administered 2016-04-22: 200 ug via INTRAVENOUS

## 2016-04-22 MED ORDER — HYDROMORPHONE HCL 1 MG/ML IJ SOLN
0.5000 mg | INTRAMUSCULAR | Status: AC | PRN
  Administered 2016-04-22 (×4): 0.5 mg via INTRAVENOUS

## 2016-04-22 MED ORDER — BUPIVACAINE HCL (PF) 0.25 % IJ SOLN
INTRAMUSCULAR | Status: AC
  Filled 2016-04-22: qty 30

## 2016-04-22 MED ORDER — FENTANYL CITRATE (PF) 100 MCG/2ML IJ SOLN
INTRAMUSCULAR | Status: DC | PRN
  Administered 2016-04-22: 50 ug via INTRAVENOUS
  Administered 2016-04-22: 100 ug via INTRAVENOUS
  Administered 2016-04-22 (×2): 50 ug via INTRAVENOUS

## 2016-04-22 MED ORDER — GLUCAGON HCL RDNA (DIAGNOSTIC) 1 MG IJ SOLR
INTRAMUSCULAR | Status: AC
  Filled 2016-04-22: qty 1

## 2016-04-22 SURGICAL SUPPLY — 40 items
APPLIER CLIP 5 13 M/L LIGAMAX5 (MISCELLANEOUS) ×3
BLADE SURG 15 STRL LF DISP TIS (BLADE) ×1 IMPLANT
BLADE SURG 15 STRL SS (BLADE) ×2
CANISTER SUCT 1200ML W/VALVE (MISCELLANEOUS) ×3 IMPLANT
CHLORAPREP W/TINT 26ML (MISCELLANEOUS) ×3 IMPLANT
CHOLANGIOGRAM CATH TAUT (CATHETERS) IMPLANT
CLEANER CAUTERY TIP 5X5 PAD (MISCELLANEOUS) ×1 IMPLANT
CLIP APPLIE 5 13 M/L LIGAMAX5 (MISCELLANEOUS) ×1 IMPLANT
DECANTER SPIKE VIAL GLASS SM (MISCELLANEOUS) IMPLANT
DEVICE TROCAR PUNCTURE CLOSURE (ENDOMECHANICALS) IMPLANT
DRAPE C-ARM XRAY 36X54 (DRAPES) ×3 IMPLANT
ELECT REM PT RETURN 9FT ADLT (ELECTROSURGICAL) ×3
ELECTRODE REM PT RTRN 9FT ADLT (ELECTROSURGICAL) ×1 IMPLANT
ENDOPOUCH RETRIEVER 10 (MISCELLANEOUS) ×3 IMPLANT
GLOVE BIO SURGEON STRL SZ7 (GLOVE) ×15 IMPLANT
GOWN STRL REUS W/ TWL LRG LVL3 (GOWN DISPOSABLE) ×3 IMPLANT
GOWN STRL REUS W/TWL LRG LVL3 (GOWN DISPOSABLE) ×6
IRRIGATION STRYKERFLOW (MISCELLANEOUS) ×1 IMPLANT
IRRIGATOR STRYKERFLOW (MISCELLANEOUS) ×3
IV CATH ANGIO 12GX3 LT BLUE (NEEDLE) ×3 IMPLANT
IV SOD CHL 0.9% 1000ML (IV SOLUTION) ×3 IMPLANT
L-HOOK LAP DISP 36CM (ELECTROSURGICAL) ×3
LHOOK LAP DISP 36CM (ELECTROSURGICAL) ×1 IMPLANT
LIQUID BAND (GAUZE/BANDAGES/DRESSINGS) ×3 IMPLANT
NEEDLE HYPO 22GX1.5 SAFETY (NEEDLE) ×3 IMPLANT
PACK LAP CHOLECYSTECTOMY (MISCELLANEOUS) ×3 IMPLANT
PAD CLEANER CAUTERY TIP 5X5 (MISCELLANEOUS) ×2
PENCIL ELECTRO HAND CTR (MISCELLANEOUS) ×3 IMPLANT
SCISSORS METZENBAUM CVD 33 (INSTRUMENTS) ×3 IMPLANT
SLEEVE ENDOPATH XCEL 5M (ENDOMECHANICALS) ×6 IMPLANT
STOPCOCK 3 WAY  REPLAC (MISCELLANEOUS) IMPLANT
SUT ETHIBOND 0 MO6 C/R (SUTURE) IMPLANT
SUT MNCRL AB 4-0 PS2 18 (SUTURE) ×3 IMPLANT
SUT VIC AB 0 CT2 27 (SUTURE) IMPLANT
SUT VICRYL 0 AB UR-6 (SUTURE) ×6 IMPLANT
SYR 20CC LL (SYRINGE) ×3 IMPLANT
TROCAR XCEL BLUNT TIP 100MML (ENDOMECHANICALS) ×3 IMPLANT
TROCAR XCEL NON-BLD 5MMX100MML (ENDOMECHANICALS) ×3 IMPLANT
TUBING INSUFFLATOR HI FLOW (MISCELLANEOUS) ×3 IMPLANT
WATER STERILE IRR 1000ML POUR (IV SOLUTION) ×3 IMPLANT

## 2016-04-22 NOTE — Anesthesia Procedure Notes (Signed)
Procedure Name: Intubation Date/Time: 04/22/2016 1:29 PM Performed by: Michaele OfferSAVAGE, Corbet Hanley Pre-anesthesia Checklist: Patient identified, Emergency Drugs available, Suction available, Patient being monitored and Timeout performed Patient Re-evaluated:Patient Re-evaluated prior to inductionOxygen Delivery Method: Circle system utilized Preoxygenation: Pre-oxygenation with 100% oxygen Intubation Type: IV induction Ventilation: Mask ventilation without difficulty Laryngoscope Size: Mac and 3 Grade View: Grade I Tube type: Oral Tube size: 7.0 mm Number of attempts: 1 Airway Equipment and Method: Rigid stylet Placement Confirmation: ETT inserted through vocal cords under direct vision,  positive ETCO2 and breath sounds checked- equal and bilateral Secured at: 20 cm Tube secured with: Tape Dental Injury: Teeth and Oropharynx as per pre-operative assessment

## 2016-04-22 NOTE — Anesthesia Preprocedure Evaluation (Signed)
Anesthesia Evaluation  Patient identified by MRN, date of birth, ID band Patient awake    Reviewed: Allergy & Precautions, H&P , NPO status , Patient's Chart, lab work & pertinent test results, reviewed documented beta blocker date and time   History of Anesthesia Complications Negative for: history of anesthetic complications  Airway Mallampati: I  TM Distance: >3 FB Neck ROM: full    Dental no notable dental hx. (+) Missing, Teeth Intact   Pulmonary neg shortness of breath, asthma (as a child) , neg sleep apnea, neg COPD, neg recent URI,    Pulmonary exam normal breath sounds clear to auscultation       Cardiovascular Exercise Tolerance: Good negative cardio ROS Normal cardiovascular exam Rhythm:regular Rate:Normal     Neuro/Psych negative neurological ROS  negative psych ROS   GI/Hepatic Neg liver ROS, GERD  Medicated,  Endo/Other  negative endocrine ROS  Renal/GU negative Renal ROS  negative genitourinary   Musculoskeletal   Abdominal   Peds  Hematology negative hematology ROS (+)   Anesthesia Other Findings Past Medical History:   GERD (gastroesophageal reflux disease)                       Headache                                                       Comment:Migraines   Hyperlipidemia                                               Reproductive/Obstetrics negative OB ROS                             Anesthesia Physical Anesthesia Plan  ASA: II  Anesthesia Plan: General   Post-op Pain Management:    Induction:   Airway Management Planned:   Additional Equipment:   Intra-op Plan:   Post-operative Plan:   Informed Consent: I have reviewed the patients History and Physical, chart, labs and discussed the procedure including the risks, benefits and alternatives for the proposed anesthesia with the patient or authorized representative who has indicated his/her understanding  and acceptance.   Dental Advisory Given  Plan Discussed with: Anesthesiologist, CRNA and Surgeon  Anesthesia Plan Comments:         Anesthesia Quick Evaluation

## 2016-04-22 NOTE — Transfer of Care (Signed)
Immediate Anesthesia Transfer of Care Note  Patient: Casey Hansen  Procedure(s) Performed: Procedure(s): LAPAROSCOPIC CHOLECYSTECTOMY WITH INTRAOPERATIVE CHOLANGIOGRAM (N/A)  Patient Location: PACU  Anesthesia Type:General  Level of Consciousness: awake, alert , oriented and patient cooperative  Airway & Oxygen Therapy: Patient Spontanous Breathing and Patient connected to face mask oxygen  Post-op Assessment: Report given to RN, Post -op Vital signs reviewed and stable and Patient moving all extremities X 4  Post vital signs: Reviewed and stable  Last Vitals:  Filed Vitals:   04/22/16 1106 04/22/16 1255  BP: 139/94 131/91  Pulse: 81 95  Temp: 36.8 C 37.8 C  Resp:  16    Last Pain:  Filed Vitals:   04/22/16 1257  PainSc: 3          Complications: No apparent anesthesia complications

## 2016-04-22 NOTE — Progress Notes (Signed)
Patient complained of itching, notified Dr. Everlene FarrierPabon new orders received.  Pt later shared that itching subsided without receiving prn benadryl. Pt resting in bed. Continue to assess.

## 2016-04-22 NOTE — H&P (Signed)
Expand All Collapse All   H/P  04/22/2016  Casey Hansen is an 25 y.o. female.   CC: Right upper quadrant pain  HPI: This patient with 3 days of right upper quadrant pain with nausea no vomiting no jaundice or acholic stools no fevers or chills but she has had multiple episodes of this dating back to when she was pregnant with her now 57-year-old son. I was asked see the patient with elevated liver function tests and a dilated bile duct on ultrasound all suggestive of choledocholithiasis. Her pain is been unrelenting and points to the right upper quadrant She was seen by Dr. Burt Knack last night and because of social issues, she went home and returned to be admitted this am. She has inc LFT, ALk phos, CBD 7 mm. Nml bili.  Past Medical History  Diagnosis Date  . GERD (gastroesophageal reflux disease)   . Headache     Migraines  . Hyperlipidemia     Past Surgical History  Procedure Laterality Date  . No past surgeries    . Esophagogastroduodenoscopy (egd) with propofol N/A 12/21/2015    Procedure: ESOPHAGOGASTRODUODENOSCOPY (EGD) WITH PROPOFOL; Surgeon: Hulen Luster, MD; Location: [facility]; Service: Gastroenterology; Laterality: N/A;    No family history on file.  Social History:  reports that she has never smoked. She does not have any smokeless tobacco history on file. She reports that she does not drink alcohol or use illicit drugs.  Allergies: No Known Allergies  Medications reviewed.   Review of Systems:   Review of Systems  Constitutional: Negative for fever and chills.  HENT: Negative.  Eyes: Negative.  Respiratory: Negative.  Cardiovascular: Negative.  Gastrointestinal: Positive for nausea, abdominal pain and melena. Negative for heartburn, vomiting, diarrhea, constipation and blood in stool.  Genitourinary: Negative.  Musculoskeletal: Negative.  Skin: Negative.  Neurological: Negative.  Endo/Heme/Allergies: Negative.   Psychiatric/Behavioral: Negative.     Physical Exam:  BP 152/119 mmHg  Pulse 79  Temp(Src) 98.4 F (36.9 C) (Oral)  Resp 16  Ht '5\' 5"'$  (1.651 m)  Wt 200 lb (90.719 kg)  BMI 33.28 kg/m2  SpO2 100%  LMP 04/14/2016  Physical Exam  Constitutional: She is oriented to person, place, and time and well-developed, well-nourished, and in no distress. No distress.  Appears quite comfortable no icterus no jaundice  HENT:  Head: Normocephalic and atraumatic.  Eyes: Pupils are equal, round, and reactive to light. Right eye exhibits no discharge. Left eye exhibits no discharge. No scleral icterus.  Neck: Normal range of motion.  Cardiovascular: Normal rate and regular rhythm.  Pulmonary/Chest: Effort normal and breath sounds normal. No respiratory distress. She has no wheezes. She has no rales.  Abdominal: Soft. She exhibits no distension. There is tenderness. There is no rebound and no guarding.  Mild Tenderness in right upper quadrant , no periotnitis Musculoskeletal: Normal range of motion. She exhibits no edema or tenderness.  Lymphadenopathy:   She has no cervical adenopathy.  Neurological: She is alert and oriented to person, place, and time.  Skin: Skin is warm and dry. She is not diaphoretic. No erythema.  Psychiatric: Mood and affect normal.  Vitals reviewed.      Lab Results Last 48 Hours    Results for orders placed or performed during the hospital encounter of 04/21/16 (from the past 48 hour(s))  Lipase, blood Status: None   Collection Time: 04/21/16 5:09 PM  Result Value Ref Range   Lipase 19 11 - 51 U/L  Comprehensive metabolic  panel Status: Abnormal   Collection Time: 04/21/16 5:09 PM  Result Value Ref Range   Sodium 138 135 - 145 mmol/L   Potassium 3.4 (L) 3.5 - 5.1 mmol/L   Chloride 102 101 - 111 mmol/L   CO2 25 22 - 32 mmol/L   Glucose, Bld 95 65 - 99 mg/dL   BUN 10 6 - 20 mg/dL   Creatinine, Ser  0.64 0.44 - 1.00 mg/dL   Calcium 9.4 8.9 - 10.3 mg/dL   Total Protein 7.8 6.5 - 8.1 g/dL   Albumin 4.3 3.5 - 5.0 g/dL   AST 274 (H) 15 - 41 U/L   ALT 101 (H) 14 - 54 U/L   Alkaline Phosphatase 153 (H) 38 - 126 U/L   Total Bilirubin 0.6 0.3 - 1.2 mg/dL   GFR calc non Af Amer >60 >60 mL/min   GFR calc Af Amer >60 >60 mL/min    Comment: (NOTE) The eGFR has been calculated using the CKD EPI equation. This calculation has not been validated in all clinical situations. eGFR's persistently <60 mL/min signify possible Chronic Kidney Disease.    Anion gap 11 5 - 15  CBC Status: Abnormal   Collection Time: 04/21/16 5:09 PM  Result Value Ref Range   WBC 11.9 (H) 3.6 - 11.0 K/uL   RBC 5.27 (H) 3.80 - 5.20 MIL/uL   Hemoglobin 14.9 12.0 - 16.0 g/dL   HCT 44.4 35.0 - 47.0 %   MCV 84.3 80.0 - 100.0 fL   MCH 28.2 26.0 - 34.0 pg   MCHC 33.5 32.0 - 36.0 g/dL   RDW 13.2 11.5 - 14.5 %   Platelets 312 150 - 440 K/uL  Urinalysis complete, with microscopic Status: Abnormal   Collection Time: 04/21/16 5:09 PM  Result Value Ref Range   Color, Urine YELLOW (A) YELLOW   APPearance CLEAR (A) CLEAR   Glucose, UA NEGATIVE NEGATIVE mg/dL   Bilirubin Urine NEGATIVE NEGATIVE   Ketones, ur NEGATIVE NEGATIVE mg/dL   Specific Gravity, Urine 1.019 1.005 - 1.030   Hgb urine dipstick NEGATIVE NEGATIVE   pH 6.0 5.0 - 8.0   Protein, ur NEGATIVE NEGATIVE mg/dL   Nitrite NEGATIVE NEGATIVE   Leukocytes, UA NEGATIVE NEGATIVE   RBC / HPF 0-5 0 - 5 RBC/hpf   WBC, UA 0-5 0 - 5 WBC/hpf   Bacteria, UA NONE SEEN NONE SEEN   Squamous Epithelial / LPF 0-5 (A) NONE SEEN   Mucous PRESENT   Pregnancy, urine POC Status: None   Collection Time: 04/21/16 5:18 PM  Result Value Ref Range   Preg Test, Ur NEGATIVE NEGATIVE    Comment:    THE SENSITIVITY OF THIS METHODOLOGY IS >24 mIU/mL       Imaging Results (Last 48 hours)    US Abdomen Limited Ruq  04/21/2016 CLINICAL DATA: Right upper quadrant pain EXAM: US ABDOMEN LIMITED - RIGHT UPPER QUADRANT COMPARISON: CT 02/15/2013 FINDINGS: Gallbladder: Multiple mobile gallstones noted within the gallbladder. No wall thickening. The largest stone 16 mm. No sonographic Murphy sign. Common bile duct: Diameter: Borderline in diameter at 7 mm. Liver: No focal lesion identified. Within normal limits in parenchymal echogenicity. IMPRESSION: Cholelithiasis. Common bile duct is upper limits normal in diameter at 7 mm. No visible ductal stones, but if there is clinical concern, this could be further evaluated with MRCP or ERCP. Electronically Signed By: Rolm Baptise M.D. On: 04/21/2016 20:38     Assessment/Plan:  This a patient with right upper quadrant pain of  a long-standing nature worsened over the last 3 days now she has signs of choledocholithiasis with LFT elevation as well as a dilated common bile duct. I recommended admission to the hospital and instituting antibiotic therapy and rechecking LFTs in the morning to consider laparoscopic cholecystectomy with cholangiography at some point.  D/w the pt about the options preop ERCP vs lap chole and IOC . If CBD stone is found post op ERCP. D/W her in detail about the procedure, risks, benefits and possible complications including but not limited to CBD injury, open, bleeding infection. She wishes to proceed w lap chole first. Extensive Counseling provided  Florene Glen, MD, FACS

## 2016-04-22 NOTE — Op Note (Signed)
Laparoscopic Cholecystectomy  Pre-operative Diagnosis: Chronic cholecystitis with choledocholithiasis  Post-operative Diagnosis: Same  Procedure: Laparoscopic lysis of adhesions taking at least 20 minutes of total operative time                      Laparoscopic cholecystectomy with intraoperative cholangiogram  Surgeon: Sterling Bigiego Pabon, MD FACS  Anesthesia: Gen. with endotracheal tube    Findings: Chronic Cholecystitis  Choledocholithiasis on intraoperative cholangiogram. Contrast failed to reach the duodenum. No evidence of common bile duct injury. Distal CBD filling defect  Estimated Blood Loss: 20cc         Drains: none         Specimens: Gallbladder           Complications: none   Procedure Details  The patient was seen again in the Holding Room. The benefits, complications, treatment options, and expected outcomes were discussed with the patient. The risks of bleeding, infection, recurrence of symptoms, failure to resolve symptoms, bile duct damage, bile duct leak, retained common bile duct stone, bowel injury, any of which could require further surgery and/or ERCP, stent, or papillotomy were reviewed with the patient. The likelihood of improving the patient's symptoms with return to their baseline status is good.  The patient and/or family concurred with the proposed plan, giving informed consent.  The patient was taken to Operating Room, identified as Felix Pacinimanda L Loeper and the procedure verified as Laparoscopic Cholecystectomy.  A Time Out was held and the above information confirmed.  Prior to the induction of general anesthesia, antibiotic prophylaxis was administered. VTE prophylaxis was in place. General endotracheal anesthesia was then administered and tolerated well. After the induction, the abdomen was prepped with Chloraprep and draped in the sterile fashion. The patient was positioned in the supine position.  Local anesthetic  was injected into the skin near the umbilicus  and an incision made. Cut down technique was used to enter the abdominal cavity and a Hasson trochar was placed after two vicryl stitches were anchored to the fascia. Pneumoperitoneum was then created with CO2 and tolerated well without any adverse changes in the patient's vital signs.  Three 5-mm ports were placed in the right upper quadrant all under direct vision. All skin incisions  were infiltrated with a local anesthetic agent before making the incision and placing the trocars.   The patient was positioned  in reverse Trendelenburg, tilted slightly to the patient's left. Thick Adhesions were encountered from the omentum to the gallbladder and also from the gallbladder to the duodenum and from the omentum to the liver. These were taken down carefully using electrocautery and sharp scissors. The gallbladder was identified, the fundus grasped and retracted cephalad. Adhesions were lysed bluntly. The infundibulum was grasped and retracted laterally, exposing the peritoneum overlying the triangle of Calot. This was then divided and exposed in a blunt fashion. An extended critical view of the cystic duct and cystic artery was obtained.  The cystic duct was clearly identified and bluntly dissected. Cholangiogram performing the standard fashion showing evidence of a retained distal common bile duct stone. There was no evidence of any contrast into the duodenum. There was retrograde contrast all the way to the hepatic radicles. No evidence of common bile duct injury. Common bile duct measured approximately 10 mm. I gave the patient glucagon and flushed the common bile duct and repeated the cholangiogram with same findings. Cholangiocatheter was removed and the cystic   Artery and duct were double clipped and divided. Patient  will need a postoperative ERCP  The gallbladder was taken from the gallbladder fossa in a retrograde fashion with the electrocautery. The gallbladder was removed and placed in an Endocatch  bag. The liver bed was irrigated and inspected. Hemostasis was achieved with the electrocautery. Copious irrigation was utilized and was repeatedly aspirated until clear.  The gallbladder and Endocatch sac were then removed through the epigastric port site.   Inspection of the right upper quadrant was performed. No bleeding, bile duct injury or leak, or bowel injury was noted. Pneumoperitoneum was released.  The periumbilical port site was closed with figure-of-eight 0 Vicryl sutures. 4-0 subcuticular Monocryl was used to close the skin. Dermabond was  applied.  The patient was then extubated and brought to the recovery room in stable condition. Sponge, lap, and needle counts were correct at closure and at the conclusion of the case.               Sterling Big, MD, FACS

## 2016-04-23 LAB — COMPREHENSIVE METABOLIC PANEL
ALT: 268 U/L — ABNORMAL HIGH (ref 14–54)
AST: 190 U/L — AB (ref 15–41)
Albumin: 3.5 g/dL (ref 3.5–5.0)
Alkaline Phosphatase: 168 U/L — ABNORMAL HIGH (ref 38–126)
Anion gap: 6 (ref 5–15)
BILIRUBIN TOTAL: 0.8 mg/dL (ref 0.3–1.2)
BUN: 10 mg/dL (ref 6–20)
CO2: 27 mmol/L (ref 22–32)
CREATININE: 0.63 mg/dL (ref 0.44–1.00)
Calcium: 9 mg/dL (ref 8.9–10.3)
Chloride: 103 mmol/L (ref 101–111)
Glucose, Bld: 124 mg/dL — ABNORMAL HIGH (ref 65–99)
Potassium: 3.9 mmol/L (ref 3.5–5.1)
Sodium: 136 mmol/L (ref 135–145)
TOTAL PROTEIN: 6.6 g/dL (ref 6.5–8.1)

## 2016-04-23 MED ORDER — INDOMETHACIN 50 MG RE SUPP
100.0000 mg | Freq: Once | RECTAL | Status: AC
  Administered 2016-04-24: 100 mg via RECTAL
  Filled 2016-04-23 (×2): qty 2

## 2016-04-23 NOTE — Progress Notes (Signed)
CC: S/p lap chole Subjective: Doing well sxs much improved, ambulating.  Objective: Vital signs in last 24 hours: Temp:  [97.2 F (36.2 C)-98.1 F (36.7 C)] 98 F (36.7 C) (05/10 1243) Pulse Rate:  [63-109] 70 (05/10 1243) Resp:  [12-20] 18 (05/10 1243) BP: (104-134)/(63-80) 104/65 mmHg (05/10 1243) SpO2:  [93 %-100 %] 99 % (05/10 1243) Last BM Date: 04/22/16  Intake/Output from previous day: 05/09 0701 - 05/10 0700 In: 2108 [P.O.:118; I.V.:1990] Out: 1170 [Urine:1150; Blood:20] Intake/Output this shift: Total I/O In: -  Out: 400 [Urine:400]  Physical exam: NAD  Abd: soft, incisions c/d/i, no infection  Lab Results: CBC   Recent Labs  04/21/16 1709 04/22/16 1113  WBC 11.9* 8.0  HGB 14.9 15.0  HCT 44.4 44.7  PLT 312 307   BMET  Recent Labs  04/22/16 1113 04/23/16 0429  NA 138 136  K 3.8 3.9  CL 105 103  CO2 24 27  GLUCOSE 89 124*  BUN 9 10  CREATININE 0.62 0.63  CALCIUM 9.5 9.0   PT/INR No results for input(s): LABPROT, INR in the last 72 hours. ABG No results for input(s): PHART, HCO3 in the last 72 hours.  Invalid input(s): PCO2, PO2  Studies/Results: Dg Cholangiogram Operative  04/22/2016  CLINICAL DATA:  Cholelithiasis. EXAM: INTRAOPERATIVE CHOLANGIOGRAM TECHNIQUE: Cholangiographic images from the C-arm fluoroscopic device were submitted for interpretation post-operatively. Please see the procedural report for the amount of contrast and the fluoroscopy time utilized. COMPARISON:  Ultrasound 04/21/2016 FINDINGS: Contrast opacification of the common bile duct and intrahepatic bile ducts. There is mild dilatation of the biliary system. However, there is no drainage into the duodenum. There is concern for a filling defect or obstruction in the distal common bile duct. IMPRESSION: Mild dilatation of the biliary system without drainage into the duodenum. Findings raise concern for a distal biliary obstruction and cannot exclude an obstructing stone.  Consider further evaluation with ERCP or MRCP. Electronically Signed   By: Richarda OverlieAdam  Henn M.D.   On: 04/22/2016 14:57   [place] Abdomen Limited Ruq  04/21/2016  CLINICAL DATA:  Right upper quadrant pain EXAM: US ABDOMEN LIMITED - RIGHT UPPER QUADRANT COMPARISON:  CT 02/15/2013 FINDINGS: Gallbladder: Multiple mobile gallstones noted within the gallbladder. No wall thickening. The largest stone 16 mm. No sonographic Murphy sign. Common bile duct: Diameter: Borderline in diameter at 7 mm. Liver: No focal lesion identified. Within normal limits in parenchymal echogenicity. IMPRESSION: Cholelithiasis. Common bile duct is upper limits normal in diameter at 7 mm. No visible ductal stones, but if there is clinical concern, this could be further evaluated with MRCP or ERCP. Electronically Signed   By: Charlett NoseKevin  Dover M.D.   On: 04/21/2016 20:38    Anti-infectives: Anti-infectives    Start     Dose/Rate Route Frequency Ordered Stop   04/22/16 1200  piperacillin-tazobactam (ZOSYN) IVPB 3.375 g     3.375 g 12.5 mL/hr over 240 Minutes Intravenous Every 8 hours 04/22/16 1101     04/22/16 1115  cefTRIAXone (ROCEPHIN) 2 g in dextrose 5 % 50 mL IVPB     2 g 100 mL/hr over 30 Minutes Intravenous On call to O.R. 04/22/16 1101 04/22/16 1411   04/22/16 1115  metroNIDAZOLE (FLAGYL) IVPB 500 mg     500 mg 100 mL/hr over 60 Minutes Intravenous On call to O.R. 04/22/16 1101 04/22/16 1406      Assessment/Plan: POD # 1 doing well choledocholithiasis, no evidence of cholangitis ERCP tomorrow  Sterling Bigiego Malgorzata Albert, MD, FACS  04/23/2016      

## 2016-04-23 NOTE — Progress Notes (Signed)
Asked to see the patient for an ERCP. Patient told risks of procedure and agrees to have it done. Will plan for to morrow.

## 2016-04-23 NOTE — Progress Notes (Signed)
Pt ambulated around nursing station, did very well. Total feet ambulated was 160 ft. Will continue to encourage ambulation.   Karsten RoLauren E Hobbs

## 2016-04-23 NOTE — Progress Notes (Signed)
Pt vomited 200 mL once that had a tan color appearance, after she had an apple juice. After she vomited, she stated she felt better. Will continue to monitor pt.   Casey Hansen

## 2016-04-23 NOTE — Anesthesia Postprocedure Evaluation (Signed)
Anesthesia Post Note  Patient: Casey Hansen  Procedure(s) Performed: Procedure(s) (LRB): LAPAROSCOPIC CHOLECYSTECTOMY WITH INTRAOPERATIVE CHOLANGIOGRAM (N/A)  Patient location during evaluation: PACU Anesthesia Type: General Level of consciousness: awake and alert Pain management: pain level controlled Vital Signs Assessment: post-procedure vital signs reviewed and stable Respiratory status: spontaneous breathing, nonlabored ventilation, respiratory function stable and patient connected to nasal cannula oxygen Cardiovascular status: blood pressure returned to baseline and stable Postop Assessment: no signs of nausea or vomiting Anesthetic complications: no    Last Vitals:  Filed Vitals:   04/23/16 0406 04/23/16 1243  BP: 107/66 104/65  Pulse: 76 70  Temp: 36.7 C 36.7 C  Resp: 18 18    Last Pain:  Filed Vitals:   04/23/16 1244  PainSc: 4                  Lenard SimmerAndrew Lemma Tetro

## 2016-04-24 ENCOUNTER — Encounter: Payer: Self-pay | Admitting: *Deleted

## 2016-04-24 ENCOUNTER — Inpatient Hospital Stay: Payer: Medicaid Other | Admitting: Anesthesiology

## 2016-04-24 ENCOUNTER — Encounter
Admission: AD | Disposition: A | Discharge status: Home / Self Care | Payer: Self-pay | Source: Ambulatory Visit | Attending: Surgery

## 2016-04-24 DIAGNOSIS — R932 Abnormal findings on diagnostic imaging of liver and biliary tract: Secondary | ICD-10-CM

## 2016-04-24 DIAGNOSIS — K8050 Calculus of bile duct without cholangitis or cholecystitis without obstruction: Secondary | ICD-10-CM | POA: Insufficient documentation

## 2016-04-24 HISTORY — PX: ENDOSCOPIC RETROGRADE CHOLANGIOPANCREATOGRAPHY (ERCP) WITH PROPOFOL: SHX5810

## 2016-04-24 LAB — SURGICAL PATHOLOGY

## 2016-04-24 SURGERY — ENDOSCOPIC RETROGRADE CHOLANGIOPANCREATOGRAPHY (ERCP) WITH PROPOFOL
Anesthesia: General

## 2016-04-24 MED ORDER — LIDOCAINE HCL (CARDIAC) 20 MG/ML IV SOLN
INTRAVENOUS | Status: DC | PRN
  Administered 2016-04-24: 60 mg via INTRAVENOUS

## 2016-04-24 MED ORDER — LACTATED RINGERS IV SOLN
INTRAVENOUS | Status: DC
  Administered 2016-04-24: 11:00:00 via INTRAVENOUS

## 2016-04-24 MED ORDER — FENTANYL CITRATE (PF) 100 MCG/2ML IJ SOLN
INTRAMUSCULAR | Status: DC | PRN
  Administered 2016-04-24: 50 ug via INTRAVENOUS

## 2016-04-24 MED ORDER — MIDAZOLAM HCL 2 MG/2ML IJ SOLN
INTRAMUSCULAR | Status: DC | PRN
  Administered 2016-04-24 (×2): 1 mg via INTRAVENOUS

## 2016-04-24 MED ORDER — GLYCOPYRROLATE 0.2 MG/ML IJ SOLN
INTRAMUSCULAR | Status: DC | PRN
  Administered 2016-04-24: 0.2 mg via INTRAVENOUS

## 2016-04-24 MED ORDER — PROPOFOL 10 MG/ML IV BOLUS
INTRAVENOUS | Status: DC | PRN
  Administered 2016-04-24 (×2): 30 mg via INTRAVENOUS
  Administered 2016-04-24: 40 mg via INTRAVENOUS

## 2016-04-24 MED ORDER — IOTHALAMATE MEGLUMINE 60 % INJ SOLN
INTRAMUSCULAR | Status: DC | PRN
Start: 2016-04-24 — End: 2016-04-24
  Administered 2016-04-24: 5 mL

## 2016-04-24 MED ORDER — PROPOFOL 500 MG/50ML IV EMUL
INTRAVENOUS | Status: DC | PRN
  Administered 2016-04-24: 160 ug/kg/min via INTRAVENOUS

## 2016-04-24 NOTE — Op Note (Signed)
[facility] Gastroenterology Patient Name: Casey Hansen Procedure Date: 04/24/2016 11:28 AM MRN: [id number] Account #: [id number] Date of Birth: 1991/02/08 Admit Type: Inpatient Age: 25 Room: [facility] ENDO ROOM 4 Gender: Female Note Status: Finalized Procedure:            ERCP Indications:          Suspected bile duct stone(s) Providers:            Midge Minium, MD Medicines:            Propofol per Anesthesia Complications:        No immediate complications. Procedure:            Pre-Anesthesia Assessment:                       - Prior to the procedure, a History and Physical was                        performed, and patient medications and allergies were                        reviewed. The patient's tolerance of previous                        anesthesia was also reviewed. The risks and benefits of                        the procedure and the sedation options and risks were                        discussed with the patient. All questions were                        answered, and informed consent was obtained. Prior                        Anticoagulants: The patient has taken no previous                        anticoagulant or antiplatelet agents. ASA Grade                        Assessment: II - A patient with mild systemic disease.                        After reviewing the risks and benefits, the patient was                        deemed in satisfactory condition to undergo the                        procedure.                       After obtaining informed consent, the scope was passed                        under direct vision. Throughout the procedure, the                        patient's blood pressure, pulse, and  oxygen saturations                        were monitored continuously. The Endosonoscope was                        introduced through the mouth, and used to inject                        contrast into and used to inject contrast into the bile                    duct. The ERCP was accomplished without difficulty. The                        patient tolerated the procedure well. Findings:      A scout film of the abdomen was obtained. Surgical clips, consistent       with a previous cholecystectomy, were seen in the area of the right       upper quadrant of the abdomen. The esophagus was successfully intubated       under direct vision. The scope was advanced to a normal major papilla in       the descending duodenum without detailed examination of the pharynx,       larynx and associated structures, and upper GI tract. The upper GI tract       was grossly normal. The bile duct was deeply cannulated. Contrast was       injected. I personally interpreted the bile duct images. There was brisk       flow of contrast through the ducts. Image quality was excellent.       Contrast extended to the entire biliary tree. The lower third of the       main bile duct contained filling defect(s). A wire was passed into the       biliary tree. A 5 mm biliary sphincterotomy was made with a       sphincterotome using ERBE electrocautery. There was no       post-sphincterotomy bleeding. The biliary tree was swept with a 15 mm       balloon starting at the bifurcation. One stone was removed. No stones       remained. Impression:           - A filling defect was seen on the cholangiogram.                       - Choledocholithiasis was found. Complete removal was                        accomplished by biliary sphincterotomy and balloon                        extraction.                       - A biliary sphincterotomy was performed.                       - The biliary tree was swept. Recommendation:       - Watch for pancreatitis, bleeding, perforation, and  cholangitis. Procedure Code(s):    --- Professional ---                       [id number], Endoscopic retrograde cholangiopancreatography                        (ERCP); with  removal of calculi/debris from                        biliary/pancreatic duct(s)                       [id number], Endoscopic retrograde cholangiopancreatography                        (ERCP); with sphincterotomy/papillotomy                       [phone], Endoscopic catheterization of the biliary ductal                        system, radiological supervision and interpretation Diagnosis Code(s):    --- Professional ---                       K80.50, Calculus of bile duct without cholangitis or                        cholecystitis without obstruction                       R93.2, Abnormal findings on diagnostic imaging of liver                        and biliary tract CPT copyright 2016 American Medical Association. All rights reserved. The codes documented in this report are preliminary and upon coder review may  be revised to meet current compliance requirements. Midge Miniumarren Jalexia Lalli, MD 04/24/2016 12:00:33 PM This report has been signed electronically. Number of Addenda: 0 Note Initiated On: 04/24/2016 11:28 AM      [facility]

## 2016-04-24 NOTE — Transfer of Care (Signed)
Immediate Anesthesia Transfer of Care Note  Patient: Casey Hansen L Risinger  Procedure(s) Performed: Procedure(s): ENDOSCOPIC RETROGRADE CHOLANGIOPANCREATOGRAPHY (ERCP) WITH PROPOFOL (N/A)  Patient Location: PACU  Anesthesia Type:General  Level of Consciousness: awake and patient cooperative  Airway & Oxygen Therapy: Patient Spontanous Breathing and Patient connected to face mask oxygen  Post-op Assessment: Report given to RN  Post vital signs: Reviewed and stable  Last Vitals:  Filed Vitals:   04/24/16 1054 04/24/16 1210  BP: 139/81 131/80  Pulse: 78 70  Temp: 36.3 C 37 C  Resp: 18 16    Last Pain:  Filed Vitals:   04/24/16 1213  PainSc: Asleep      Patients Stated Pain Goal: 0 (04/23/16 0406)  Complications: No apparent anesthesia complications

## 2016-04-24 NOTE — Progress Notes (Signed)
POD # 2 s/p lap chole reatined stone for ERCP today Currently doing well abd soft, incisions c/d/i Keep NPO

## 2016-04-24 NOTE — Anesthesia Preprocedure Evaluation (Signed)
Anesthesia Evaluation  Patient identified by MRN, date of birth, ID band Patient awake    Reviewed: Allergy & Precautions, NPO status , Patient's Chart, lab work & pertinent test results  Airway Mallampati: II       Dental  (+) Teeth Intact   Pulmonary neg pulmonary ROS,    breath sounds clear to auscultation       Cardiovascular Exercise Tolerance: Good  Rhythm:Regular     Neuro/Psych  Headaches,    GI/Hepatic Neg liver ROS, GERD  ,  Endo/Other  negative endocrine ROS  Renal/GU negative Renal ROS     Musculoskeletal negative musculoskeletal ROS (+)   Abdominal Normal abdominal exam  (+)   Peds negative pediatric ROS (+)  Hematology negative hematology ROS (+)   Anesthesia Other Findings   Reproductive/Obstetrics                             Anesthesia Physical Anesthesia Plan  ASA: II  Anesthesia Plan: MAC   Post-op Pain Management:    Induction: Intravenous  Airway Management Planned: Natural Airway and Nasal Cannula  Additional Equipment:   Intra-op Plan:   Post-operative Plan:   Informed Consent: I have reviewed the patients History and Physical, chart, labs and discussed the procedure including the risks, benefits and alternatives for the proposed anesthesia with the patient or authorized representative who has indicated his/her understanding and acceptance.     Plan Discussed with: CRNA  Anesthesia Plan Comments:         Anesthesia Quick Evaluation

## 2016-04-24 NOTE — Anesthesia Postprocedure Evaluation (Signed)
Anesthesia Post Note  Patient: Casey Hansen  Procedure(s) Performed: Procedure(s) (LRB): ENDOSCOPIC RETROGRADE CHOLANGIOPANCREATOGRAPHY (ERCP) WITH PROPOFOL (N/A)  Patient location during evaluation: PACU Anesthesia Type: General Level of consciousness: awake Pain management: satisfactory to patient Respiratory status: spontaneous breathing Cardiovascular status: stable Postop Assessment: no headache    Last Vitals:  Filed Vitals:   04/24/16 1230 04/24/16 1240  BP: 126/82 129/85  Pulse: 70 72  Temp:    Resp: 15 15    Last Pain:  Filed Vitals:   04/24/16 1242  PainSc: Asleep                 VAN STAVEREN,Joven Mom

## 2016-04-25 MED ORDER — OXYCODONE-ACETAMINOPHEN 5-325 MG PO TABS: 1.0000 | ORAL_TABLET | ORAL | Status: DC | PRN

## 2016-04-25 NOTE — Discharge Summary (Signed)
  Patient ID: Casey Hansen MRN: [id number] DOB/AGE: [birth year] 25 y.o.  Admit date: 04/22/2016 Discharge date: 04/25/2016   Discharge Diagnoses:  Active Problems:   Choledocholithiasis with acute cholecystitis   Calculus of common duct   Abnormal findings on imaging of biliary tract   Procedures: Laparoscopic cholecystectomy intraoperative cholangiogram, ERCP with sphincterotomy   Hospital Course: The 25 year old female resected with right upper quadrant pain and workup revealed acute on chronic cholecystitis. She underwent an uneventful laparoscopic cholecystectomy with intraoperative cholangiogram and cholangiogram revealed retained distal common bile duct stone. He was placed on antibiotics and Dr. wall perform an ERCP. Patient was advanced to a regular diet which she tolerated well. She didn't have any complications from her ERCP or her cholecystectomy. At the time of discharge she was ambulating, her vital signs were stable, her abdomen was soft minimal incisional tenderness, incisions were healing well with no evidence of infection. Condition of the patient the time of discharge is stable  Consults: Dr. Servando SnareWohl from GI  Disposition: 01-Home or Self Care  Discharge Instructions    Call MD for:  difficulty breathing, headache or visual disturbances    Complete by:  As directed      Call MD for:  extreme fatigue    Complete by:  As directed      Call MD for:  hives    Complete by:  As directed      Call MD for:  persistant dizziness or light-headedness    Complete by:  As directed      Call MD for:  persistant nausea and vomiting    Complete by:  As directed      Call MD for:  redness, tenderness, or signs of infection (pain, swelling, redness, odor or green/yellow discharge around incision site)    Complete by:  As directed      Call MD for:  severe uncontrolled pain    Complete by:  As directed      Call MD for:  temperature >100.4    Complete by:  As directed      Diet - low  sodium heart healthy    Complete by:  As directed      Increase activity slowly    Complete by:  As directed      Lifting restrictions    Complete by:  As directed   20 lbs     No dressing needed    Complete by:  As directed             Medication List    TAKE these medications        omeprazole 20 MG capsule  Commonly known as:  PRILOSEC  Take 20 mg by mouth daily.     oxyCODONE-acetaminophen 5-325 MG tablet  Commonly known as:  PERCOCET/ROXICET  Take 1-2 tablets by mouth every 4 (four) hours as needed for moderate pain.     SUMAtriptan 25 MG tablet  Commonly known as:  IMITREX  Take 25 mg by mouth every 2 (two) hours as needed for migraine. May repeat in 2 hours if headache persists or recurs.          Sterling Bigiego Pabon, MD FACS

## 2016-04-25 NOTE — Progress Notes (Signed)
Patient discharged home with family.  All discharge instructions reviewed and discharge paperwork given to patient.  Patient verbalized understanding.  IV removed in tact. Prescription given to patient with all questions and concerns addressed. Patient's mother at bedside for transfer home.  

## 2016-04-26 ENCOUNTER — Encounter: Payer: Self-pay | Admitting: Gastroenterology

## 2016-05-01 ENCOUNTER — Encounter: Payer: Self-pay | Admitting: Surgery

## 2016-05-01 ENCOUNTER — Ambulatory Visit (INDEPENDENT_AMBULATORY_CARE_PROVIDER_SITE_OTHER): Payer: Medicaid Other | Admitting: Surgery

## 2016-05-01 VITALS — BP 131/91 | HR 90 | Temp 98.3°F | Ht 65.0 in | Wt 202.0 lb

## 2016-05-01 DIAGNOSIS — Z09 Encounter for follow-up examination after completed treatment for conditions other than malignant neoplasm: Secondary | ICD-10-CM

## 2016-05-01 NOTE — Patient Instructions (Addendum)
Please call our office with any questions or concerns.  Please do not submerge in a tub, hot tub, or pool until incisions are completely sealed.  Use sun block to incision area over the next year if this area will be exposed to sun. This helps decrease scarring.  You may now resume your normal activities. Listen to your body when lifting, if you have pain when lifting, stop and then try again in a few days.  If you develop redness, drainage, or pain at incision sites- call our office immediately and speak with a nurse.  Take Miralax 17 G twice a day for today to help you with constipation, then take Miralax 17 G daily.

## 2016-05-01 NOTE — Progress Notes (Signed)
S/p lap chole and ERCP Overall doing well but has some persistent periumbilical pain Taking PO, has not had a bm in over 4 days No evidence of jaundice, No emesis, no fevers  PE NAD Abd: soft, NT, incisions c/d/i.  A/p likely constipation, advice for miralax, if pain persist we will order U/S and LFTS Instructed the pt to call tomorrow if sxs persist

## 2016-07-11 ENCOUNTER — Other Ambulatory Visit: Payer: Self-pay | Admitting: Nurse Practitioner

## 2016-07-11 DIAGNOSIS — R102 Pelvic and perineal pain: Secondary | ICD-10-CM

## 2016-07-17 ENCOUNTER — Ambulatory Visit: Payer: Medicaid Other

## 2016-07-23 ENCOUNTER — Ambulatory Visit: Payer: Medicaid Other | Attending: Nurse Practitioner

## 2016-10-02 DIAGNOSIS — R42 Dizziness and giddiness: Secondary | ICD-10-CM | POA: Insufficient documentation

## 2016-10-02 HISTORY — DX: Dizziness and giddiness: R42

## 2016-12-16 ENCOUNTER — Encounter: Payer: Self-pay | Admitting: *Deleted

## 2016-12-17 ENCOUNTER — Encounter: Payer: Self-pay | Admitting: Anesthesiology

## 2016-12-25 NOTE — Discharge Instructions (Signed)
[facility] °[facility] °ENDOSCOPIC SINUS SURGERY °[facility] ° °What is Functional Endoscopic Sinus Surgery? ° The Surgery involves making the natural openings of the sinuses larger by removing the bony partitions that separate the sinuses from the nasal cavity.  The natural sinus lining is preserved as much as possible to allow the sinuses to resume normal function after the surgery.  In some patients nasal polyps (excessively swollen lining of the sinuses) may be removed to relieve obstruction of the sinus openings.  The surgery is performed through the nose using lighted scopes, which eliminates the need for incisions on the face.  A septoplasty is a different procedure which is sometimes performed with sinus surgery.  It involves straightening the boy partition that separates the two sides of your nose.  A crooked or deviated septum may need repair if is obstructing the sinuses or nasal airflow.  Turbinate reduction is also often performed during sinus surgery.  The turbinates are bony proturberances from the side walls of the nose which swell and can obstruct the nose in patients with sinus and allergy problems.  Their size can be surgically reduced to help relieve nasal obstruction. ° °What Can Sinus Surgery Do For Me? ° Sinus surgery can reduce the frequency of sinus infections requiring antibiotic treatment.  This can provide improvement in nasal congestion, post-nasal drainage, facial pressure and nasal obstruction.  Surgery will NOT prevent you from ever having an infection again, so it usually only for patients who get infections 4 or more times yearly requiring antibiotics, or for infections that do not clear with antibiotics.  It will not cure nasal allergies, so patients with allergies may still require medication to treat their allergies after surgery. Surgery may improve headaches related to sinusitis, however, some people will continue to  require medication to control sinus headaches related to allergies.  Surgery will do nothing for other forms of headache (migraine, tension or cluster). ° °What Are the Risks of Endoscopic Sinus Surgery? ° Current techniques allow surgery to be performed safely with little risk, however, there are rare complications that patients should be aware of.  Because the sinuses are located around the eyes, there is risk of eye injury, including blindness, though again, this would be quite rare. This is usually a result of bleeding behind the eye during surgery, which puts the vision oat risk, though there are treatments to protect the vision and prevent permanent disrupted by surgery causing a leak of the spinal fluid that surrounds the brain.  More serious complications would include bleeding inside the brain cavity or damage to the brain.  Again, all of these complications are uncommon, and spinal fluid leaks can be safely managed surgically if they occur.  The most common complication of sinus surgery is bleeding from the nose, which may require packing or cauterization of the nose.  Continued sinus have polyps may experience recurrence of the polyps requiring revision surgery.  Alterations of sense of smell or injury to the tear ducts are also rare complications.  ° °What is the Surgery Like, and what is the Recovery? ° The Surgery usually takes a couple of hours to perform, and is usually performed under a general anesthetic (completely asleep).  Patients are usually discharged home after a couple of hours.  Sometimes during surgery it is necessary to pack the nose to control bleeding, and the packing is left in place for 24 - 48 hours, and removed by your surgeon.    If a septoplasty was performed during the procedure, there is often a splint placed which must be removed after 5-7 days.   °Discomfort: Pain is usually mild to moderate, and can be controlled by prescription pain medication or acetaminophen (Tylenol).   Aspirin, Ibuprofen (Advil, Motrin), or Naprosyn (Aleve) should be avoided, as they can cause increased bleeding.  Most patients feel sinus pressure like they have a bad head cold for several days.  Sleeping with your head elevated can help reduce swelling and facial pressure, as can ice packs over the face.  A humidifier may be helpful to keep the mucous and blood from drying in the nose.  ° °Diet: There are no specific diet restrictions, however, you should generally start with clear liquids and a light diet of bland foods because the anesthetic can cause some nausea.  Advance your diet depending on how your stomach feels.  Taking your pain medication with food will often help reduce stomach upset which pain medications can cause. ° °Nasal Saline Irrigation: It is important to remove blood clots and dried mucous from the nose as it is healing.  This is done by having you irrigate the nose at least 3 - 4 times daily with a salt water solution.  We recommend using NeilMed Sinus Rinse (available at the drug store).  Fill the squeeze bottle with the solution, bend over a sink, and insert the tip of the squeeze bottle into the nose ½ of an inch.  Point the tip of the squeeze bottle towards the inside corner of the eye on the same side your irrigating.  Squeeze the bottle and gently irrigate the nose.  If you bend forward as you do this, most of the fluid will flow back out of the nose, instead of down your throat.   The solution should be warm, near body temperature, when you irrigate.   Each time you irrigate, you should use a full squeeze bottle.  ° °Note that if you are instructed to use Nasal Steroid Sprays at any time after your surgery, irrigate with saline BEFORE using the steroid spray, so you do not wash it all out of the nose. °Another product, Nasal Saline Gel (such as AYR Nasal Saline Gel) can be applied in each nostril 3 - 4 times daily to moisture the nose and reduce scabbing or crusting. ° °Bleeding:   Bloody drainage from the nose can be expected for several days, and patients are instructed to irrigate their nose frequently with salt water to help remove mucous and blood clots.  The drainage may be dark red or brown, though some fresh blood may be seen intermittently, especially after irrigation.  Do not blow you nose, as bleeding may occur. If you must sneeze, keep your mouth open to allow air to escape through your mouth. ° °If heavy bleeding occurs: Irrigate the nose with saline to rinse out clots, then spray the nose 3 - 4 times with Afrin Nasal Decongestant Spray.  The spray will constrict the blood vessels to slow bleeding.  Pinch the lower half of your nose shut to apply pressure, and lay down with your head elevated.  Ice packs over the nose may help as well. If bleeding persists despite these measures, you should notify your doctor.  Do not use the Afrin routinely to control nasal congestion after surgery, as it can result in worsening congestion and may affect healing.  ° °Activity: Return to work varies among patients. Most patients will be out of   work at least 5 - 7 days to recover.  Patient may return to work after they are off of narcotic pain medication, and feeling well enough to perform the functions of their job.  Patients must avoid heavy lifting (over 10 pounds) or strenuous physical for 2 weeks after surgery, so your employer may need to assign you to light duty, or keep you out of work longer if light duty is not possible.  NOTE: you should not drive, operate dangerous machinery, do any mentally demanding tasks or make any important legal or financial decisions while on narcotic pain medication and recovering from the general anesthetic.  °  °Call Your Doctor Immediately if You Have Any of the Following: °1. Bleeding that you cannot control with the above measures °2. Loss of vision, double vision, bulging of the eye or black eyes. °3. Fever over 101 degrees °4. Neck stiffness with severe  headache, fever, nausea and change in mental state. °You are always encourage to call anytime with concerns, however, please call with requests for pain medication refills during office hours. ° °Office Endoscopy: During follow-up visits your doctor will remove any packing or splints that may have been placed and evaluate and clean your sinuses endoscopically.  Topical anesthetic will be used to make this as comfortable as possible, though you may want to take your pain medication prior to the visit.  How often this will need to be done varies from patient to patient.  After complete recovery from the surgery, you may need follow-up endoscopy from time to time, particularly if there is concern of recurrent infection or nasal polyps. ° ° °General Anesthesia, Adult, Care After °These instructions provide you with information about caring for yourself after your procedure. Your health care provider may also give you more specific instructions. Your treatment has been planned according to current medical practices, but problems sometimes occur. Call your health care provider if you have any problems or questions after your procedure. °What can I expect after the procedure? °After the procedure, it is common to have: °· Vomiting. °· A sore throat. °· Mental slowness. °It is common to feel: °· Nauseous. °· Cold or shivery. °· Sleepy. °· Tired. °· Sore or achy, even in parts of your body where you did not have surgery. °Follow these instructions at home: °For at least 24 hours after the procedure: °· Do not: °¨ Participate in activities where you could fall or become injured. °¨ Drive. °¨ Use heavy machinery. °¨ Drink alcohol. °¨ Take sleeping pills or medicines that cause drowsiness. °¨ Make important decisions or sign legal documents. °¨ Take care of children on your own. °· Rest. °Eating and drinking °· If you vomit, drink water, juice, or soup when you can drink without vomiting. °· Drink enough fluid to keep your  urine clear or pale yellow. °· Make sure you have little or no nausea before eating solid foods. °· Follow the diet recommended by your health care provider. °General instructions °· Have a responsible adult stay with you until you are awake and alert. °· Return to your normal activities as told by your health care provider. Ask your health care provider what activities are safe for you. °· Take over-the-counter and prescription medicines only as told by your health care provider. °· If you smoke, do not smoke without supervision. °· Keep all follow-up visits as told by your health care provider. This is important. °Contact a health care provider if: °· You continue to have nausea   or vomiting at home, and medicines are not helpful. °· You cannot drink fluids or start eating again. °· You cannot urinate after 8-12 hours. °· You develop a skin rash. °· You have fever. °· You have increasing redness at the site of your procedure. °Get help right away if: °· You have difficulty breathing. °· You have chest pain. °· You have unexpected bleeding. °· You feel that you are having a life-threatening or urgent problem. °This information is not intended to replace advice given to you by your health care provider. Make sure you discuss any questions you have with your health care provider. °Document Released: 03/09/2001 Document Revised: 05/05/2016 Document Reviewed: 11/15/2015 °Elsevier Interactive Patient Education © 2017 [employer] ° °

## 2016-12-26 ENCOUNTER — Encounter
Admission: RE | Disposition: A | Discharge status: Home / Self Care | Payer: Self-pay | Source: Ambulatory Visit | Attending: Unknown Physician Specialty

## 2016-12-26 ENCOUNTER — Ambulatory Visit
Admission: RE | Admit: 2016-12-26 | Discharge: 2016-12-26 | Disposition: A | Discharge status: Home / Self Care | Payer: BLUE CROSS/BLUE SHIELD | Source: Ambulatory Visit | Attending: Unknown Physician Specialty | Admitting: Unknown Physician Specialty

## 2016-12-26 ENCOUNTER — Ambulatory Visit: Payer: BLUE CROSS/BLUE SHIELD | Admitting: Anesthesiology

## 2016-12-26 DIAGNOSIS — J342 Deviated nasal septum: Secondary | ICD-10-CM | POA: Diagnosis not present

## 2016-12-26 DIAGNOSIS — J343 Hypertrophy of nasal turbinates: Secondary | ICD-10-CM | POA: Insufficient documentation

## 2016-12-26 DIAGNOSIS — J3489 Other specified disorders of nose and nasal sinuses: Secondary | ICD-10-CM | POA: Insufficient documentation

## 2016-12-26 DIAGNOSIS — J320 Chronic maxillary sinusitis: Secondary | ICD-10-CM | POA: Insufficient documentation

## 2016-12-26 HISTORY — PX: MAXILLARY ANTROSTOMY: SHX2003

## 2016-12-26 HISTORY — PX: IMAGE GUIDED SINUS SURGERY: SHX6570

## 2016-12-26 HISTORY — PX: TURBINATE REDUCTION: SHX6157

## 2016-12-26 HISTORY — PX: SEPTOPLASTY: SHX2393

## 2016-12-26 SURGERY — SINUS SURGERY, WITH IMAGING GUIDANCE
Anesthesia: General | Site: Nose | Wound class: Clean Contaminated

## 2016-12-26 MED ORDER — OXYCODONE HCL 5 MG PO TABS
5.0000 mg | ORAL_TABLET | Freq: Once | ORAL | Status: AC | PRN
  Administered 2016-12-26: 5 mg via ORAL

## 2016-12-26 MED ORDER — MIDAZOLAM HCL 5 MG/5ML IJ SOLN
INTRAMUSCULAR | Status: DC | PRN
Start: 2016-12-26 — End: 2016-12-26
  Administered 2016-12-26: 2 mg via INTRAVENOUS

## 2016-12-26 MED ORDER — OXYCODONE HCL 5 MG/5ML PO SOLN: 5.0000 mg | Freq: Once | ORAL | Status: AC | PRN

## 2016-12-26 MED ORDER — OXYCODONE-ACETAMINOPHEN 5-325 MG PO TABS: 1.0000 | ORAL_TABLET | ORAL | 0 refills | Status: DC | PRN

## 2016-12-26 MED ORDER — DEXAMETHASONE SODIUM PHOSPHATE 4 MG/ML IJ SOLN
INTRAMUSCULAR | Status: DC | PRN
  Administered 2016-12-26: 10 mg via INTRAVENOUS

## 2016-12-26 MED ORDER — ONDANSETRON HCL 4 MG/2ML IJ SOLN
INTRAMUSCULAR | Status: DC | PRN
  Administered 2016-12-26: 4 mg via INTRAVENOUS

## 2016-12-26 MED ORDER — SCOPOLAMINE 1 MG/3DAYS TD PT72
1.0000 | MEDICATED_PATCH | TRANSDERMAL | Status: DC
  Administered 2016-12-26: 1.5 mg via TRANSDERMAL

## 2016-12-26 MED ORDER — LIDOCAINE-EPINEPHRINE 2 %-1:100000 IJ SOLN
INTRAMUSCULAR | Status: DC | PRN
  Administered 2016-12-26: 12 mL

## 2016-12-26 MED ORDER — FENTANYL CITRATE (PF) 100 MCG/2ML IJ SOLN
25.0000 ug | INTRAMUSCULAR | Status: DC | PRN
  Administered 2016-12-26 (×2): 25 ug via INTRAVENOUS

## 2016-12-26 MED ORDER — BACITRACIN 500 UNIT/GM EX OINT
TOPICAL_OINTMENT | CUTANEOUS | Status: DC | PRN
  Administered 2016-12-26: 1 via TOPICAL

## 2016-12-26 MED ORDER — PHENYLEPHRINE HCL 0.5 % NA SOLN
NASAL | Status: DC | PRN
  Administered 2016-12-26: 30 mL via TOPICAL

## 2016-12-26 MED ORDER — ROCURONIUM BROMIDE 100 MG/10ML IV SOLN
INTRAVENOUS | Status: DC | PRN
  Administered 2016-12-26: 25 mg via INTRAVENOUS

## 2016-12-26 MED ORDER — LACTATED RINGERS IV SOLN
INTRAVENOUS | Status: DC
  Administered 2016-12-26: 11:00:00 via INTRAVENOUS

## 2016-12-26 MED ORDER — LIDOCAINE HCL (CARDIAC) 20 MG/ML IV SOLN
INTRAVENOUS | Status: DC | PRN
  Administered 2016-12-26: 50 mg via INTRAVENOUS

## 2016-12-26 MED ORDER — PROPOFOL 10 MG/ML IV BOLUS
INTRAVENOUS | Status: DC | PRN
  Administered 2016-12-26: 150 mg via INTRAVENOUS

## 2016-12-26 MED ORDER — ACETAMINOPHEN 10 MG/ML IV SOLN
1000.0000 mg | Freq: Once | INTRAVENOUS | Status: AC
  Administered 2016-12-26: 1000 mg via INTRAVENOUS

## 2016-12-26 MED ORDER — OXYMETAZOLINE HCL 0.05 % NA SOLN
6.0000 | Freq: Once | NASAL | Status: AC
  Administered 2016-12-26: 6 via NASAL

## 2016-12-26 MED ORDER — NEOSTIGMINE METHYLSULFATE 10 MG/10ML IV SOLN
INTRAVENOUS | Status: DC | PRN
  Administered 2016-12-26: 3 mg via INTRAVENOUS

## 2016-12-26 MED ORDER — FENTANYL CITRATE (PF) 100 MCG/2ML IJ SOLN
INTRAMUSCULAR | Status: DC | PRN
  Administered 2016-12-26: 100 ug via INTRAVENOUS

## 2016-12-26 MED ORDER — GLYCOPYRROLATE 0.2 MG/ML IJ SOLN
INTRAMUSCULAR | Status: DC | PRN
  Administered 2016-12-26: .2 mg via INTRAVENOUS
  Administered 2016-12-26: .4 mg via INTRAVENOUS

## 2016-12-26 MED ORDER — SULFAMETHOXAZOLE-TRIMETHOPRIM 800-160 MG PO TABS
1.0000 | ORAL_TABLET | Freq: Two times a day (BID) | ORAL | 0 refills | Status: DC
Start: 2016-12-26 — End: 2018-06-11

## 2016-12-26 SURGICAL SUPPLY — 34 items
BATTERY INSTRU NAVIGATION (MISCELLANEOUS) ×20 IMPLANT
BLADE SURG 15 STRL LF DISP TIS (BLADE) IMPLANT
BLADE SURG 15 STRL SS (BLADE)
CANISTER SUCT 1200ML W/VALVE (MISCELLANEOUS) ×5 IMPLANT
COAG SUCT 10F 3.5MM HAND CTRL (MISCELLANEOUS) ×5 IMPLANT
CUP MEDICINE 2OZ PLAST GRAD ST (MISCELLANEOUS) ×5 IMPLANT
DRAPE HEAD BAR (DRAPES) ×5 IMPLANT
DRESSING NASL FOAM PST OP SINU (MISCELLANEOUS) ×6 IMPLANT
DRSG NASAL FOAM POST OP SINU (MISCELLANEOUS) ×10
GLOVE BIO SURGEON STRL SZ7.5 (GLOVE) ×15 IMPLANT
HANDLE YANKAUER SUCT BULB TIP (MISCELLANEOUS) ×5 IMPLANT
KIT ROOM TURNOVER OR (KITS) ×5 IMPLANT
NAVIGATION MASK REG  ST (MISCELLANEOUS) ×5 IMPLANT
NEEDLE HYPO 25GX1X1/2 BEV (NEEDLE) ×5 IMPLANT
NS IRRIG 500ML POUR BTL (IV SOLUTION) ×5 IMPLANT
PACK DRAPE NASAL/ENT (PACKS) ×5 IMPLANT
PAD GROUND ADULT SPLIT (MISCELLANEOUS) ×5 IMPLANT
SOL ANTI-FOG 6CC FOG-OUT (MISCELLANEOUS) ×3 IMPLANT
SOL FOG-OUT ANTI-FOG 6CC (MISCELLANEOUS) ×2
SPLINT NASAL SEPTAL BLV .25 LG (MISCELLANEOUS) IMPLANT
SPLINT NASAL SEPTAL BLV .50 ST (MISCELLANEOUS) ×5 IMPLANT
SPONGE NEURO XRAY DETECT 1X3 (DISPOSABLE) ×5 IMPLANT
STRAP BODY AND KNEE 60X3 (MISCELLANEOUS) ×5 IMPLANT
SUT CHROMIC 3-0 (SUTURE) ×2
SUT CHROMIC 3-0 KS 27XMFL CR (SUTURE) ×3
SUT CHROMIC 5-0 (SUTURE)
SUT CHROMIC 5-0 P2 18XMFL CR (SUTURE)
SUT ETHILON 3-0 KS 30 BLK (SUTURE) ×5 IMPLANT
SUT PLAIN GUT 4-0 (SUTURE) IMPLANT
SUTURE CHRMC 3-0 KS 27XMFL CR (SUTURE) ×3 IMPLANT
SUTURE CHRMC 5-0 P2 18XMF CR (SUTURE) IMPLANT
SYRINGE 10CC LL (SYRINGE) ×5 IMPLANT
TOWEL OR 17X26 4PK STRL BLUE (TOWEL DISPOSABLE) ×5 IMPLANT
WATER STERILE IRR 250ML POUR (IV SOLUTION) ×5 IMPLANT

## 2016-12-26 NOTE — H&P (Signed)
  H+P  Reviewed and will be scanned in later. No changes noted. 

## 2016-12-26 NOTE — Op Note (Signed)
PREOPERATIVE DIAGNOSIS:  Chronic nasal obstruction. Left maxillary sinusitis  POSTOPERATIVE DIAGNOSIS:  Chronic nasal obstruction.  SURGEON:  Casey Hansen, M.D.  NAME OF PROCEDURE:  1. Nasal septoplasty. 2. Submucous resection of inferior turbinates. 3. Use of Stryker navigation system 4. Left maxillary antrostomy with removal of tissue  OPERATIVE FINDINGS:  Severe nasal septal deformity, hypertrophy of the inferior turbinates. Mucosal thickening left maxillary sinus  DESCRIPTION OF THE PROCEDURE:  Casey Hansen was identified in the holding area and taken to the operating room and placed in the supine position.  After general endotracheal anesthesia was induced, the table was turned 45 degrees and the patient was placed in a semi-Fowler position.  The nose was then topically anesthetized with Lidocaine, cotton pledgets were placed within each nostril. After approximately 5 minutes, this was removed at which time a local anesthetic of 1% Lidocaine 1:100,000 units of Epinephrine was used to inject the inferior turbinates in the nasal septum. A total of 13 ml was used. Examination of the nose showed a severe left nasal septal deformity and tremendous hypertrophied inferior turbinate.  Beginning on the right hand side a hemitransfixion incision was then created on the leading edge of the septum on the right.  A subperichondrial plane was elevated posteriorly on the left and taken back to the perpendicular plate of the ethmoid where subperiosteal plane was elevated posteriorly on the left.   An inferior rim of cartilage was removed anteriorly with care taken to leave an anterior strut to prevent nasal collapse. With this strut removed the perpendicular plate of the ethmoid was separated from the quadrangular cartilage. The large septal spur was removed.  The septum was then replaced in the midline. Reinspection through each nostril showed excellent reduction of the septal deformity. A left posterior  inferior fenestration was then created to allow hematoma drainage.  With the septoplasty completed, beginning on the left-hand side, a 15 blade was used to incise along the inferior edge of the inferior turbinate. A superior laterally based flap was then elevated. The underlying conchal bone of mucosa was excised using Knight scissors. The flap was then laid back over the turbinate stump and cauterized using suction cautery. In a similar fashion the submucous resection was performed on the right.  With the submucous resection completed bilaterally and no active bleeding, the hemitransfixion incision was then closed using two interrupted 3-0 chromic sutures.   With the septoplasty and turbinate reduction completed the operation then turned to the left maxillary antrostomy. The Stryker navigation had been applied at the beginning the case and calibrated and remained on throughout the procedure. At this point the Stryker navigation suction was placed into side the nose the uncinate process was identified as the middle turbinate was gently medialized. The maxillary sinus was was entered by taken a portion the uncinate process down and opening is antrostomy widely there was thickened mucosa there which was removed. With the sinus opened and the remainder the surgery completed plastic nasal septal splints were placed within each nostril and affixed to the septum using a 3-0 nylon suture. Stammberger was then used beneath each inferior turbinate for hemostasis.    The patient tolerated the procedure well, was returned to anesthesia, extubated in the operating room, and taken to the recovery room in stable condition.    CULTURES:  None.  SPECIMENS:  left maxillary sinus contents  ESTIMATED BLOOD LOSS:  25 cc.  Casey Hansen  12/26/2016  1:22 PM

## 2016-12-26 NOTE — Anesthesia Postprocedure Evaluation (Signed)
Anesthesia Post Note  Patient: Casey Hansen  Procedure(s) Performed: Procedure(s) (LRB): IMAGE GUIDED SINUS SURGERY (N/A) SEPTOPLASTY (N/A) MAXILLARY ANTROSTOMY (Left) TURBINATE REDUCTION (Bilateral)  Patient location during evaluation: PACU Anesthesia Type: General Level of consciousness: awake Pain management: pain level controlled Vital Signs Assessment: post-procedure vital signs reviewed and stable Respiratory status: spontaneous breathing Cardiovascular status: blood pressure returned to baseline Postop Assessment: no headache Anesthetic complications: no    Verner Cholunkle, III,  Latronda Spink D

## 2016-12-26 NOTE — Transfer of Care (Signed)
Immediate Anesthesia Transfer of Care Note  Patient: Casey Hansen  Procedure(s) Performed: Procedure(s): IMAGE GUIDED SINUS SURGERY (N/A) SEPTOPLASTY (N/A) MAXILLARY ANTROSTOMY (Left) TURBINATE REDUCTION (Bilateral)  Patient Location: PACU  Anesthesia Type: General ETT  Level of Consciousness: awake, alert  and patient cooperative  Airway and Oxygen Therapy: Patient Spontanous Breathing and Patient connected to supplemental oxygen  Post-op Assessment: Post-op Vital signs reviewed, Patient's Cardiovascular Status Stable, Respiratory Function Stable, Patent Airway and No signs of Nausea or vomiting  Post-op Vital Signs: Reviewed and stable  Complications: No apparent anesthesia complications

## 2016-12-26 NOTE — Anesthesia Preprocedure Evaluation (Signed)
Anesthesia Evaluation  Patient identified by MRN, date of birth, ID band Patient awake    Reviewed: Allergy & Precautions, H&P , NPO status , Patient's Chart, lab work & pertinent test results  Airway Mallampati: I  TM Distance: >3 FB Neck ROM: full    Dental no notable dental hx.    Pulmonary neg pulmonary ROS,    Pulmonary exam normal        Cardiovascular negative cardio ROS Normal cardiovascular exam     Neuro/Psych    GI/Hepatic Neg liver ROS, Medicated,  Endo/Other  negative endocrine ROS  Renal/GU negative Renal ROS     Musculoskeletal   Abdominal   Peds  Hematology negative hematology ROS (+)   Anesthesia Other Findings   Reproductive/Obstetrics                            Anesthesia Physical Anesthesia Plan  ASA: II  Anesthesia Plan: General ETT   Post-op Pain Management:    Induction:   Airway Management Planned:   Additional Equipment:   Intra-op Plan:   Post-operative Plan:   Informed Consent: I have reviewed the patients History and Physical, chart, labs and discussed the procedure including the risks, benefits and alternatives for the proposed anesthesia with the patient or authorized representative who has indicated his/her understanding and acceptance.     Plan Discussed with:   Anesthesia Plan Comments:         Anesthesia Quick Evaluation

## 2016-12-29 ENCOUNTER — Encounter: Payer: Self-pay | Admitting: Unknown Physician Specialty

## 2016-12-30 LAB — SURGICAL PATHOLOGY

## 2017-01-21 DIAGNOSIS — G43909 Migraine, unspecified, not intractable, without status migrainosus: Secondary | ICD-10-CM | POA: Insufficient documentation

## 2017-05-06 DIAGNOSIS — K5900 Constipation, unspecified: Secondary | ICD-10-CM | POA: Insufficient documentation

## 2017-05-06 DIAGNOSIS — R142 Eructation: Secondary | ICD-10-CM | POA: Insufficient documentation

## 2017-05-06 DIAGNOSIS — R140 Abdominal distension (gaseous): Secondary | ICD-10-CM | POA: Insufficient documentation

## 2017-05-06 HISTORY — DX: Constipation, unspecified: K59.00

## 2017-05-28 ENCOUNTER — Encounter: Payer: Self-pay | Admitting: Emergency Medicine

## 2017-05-28 ENCOUNTER — Emergency Department: Payer: BLUE CROSS/BLUE SHIELD

## 2017-05-28 ENCOUNTER — Emergency Department
Admission: EM | Admit: 2017-05-28 | Discharge: 2017-05-28 | Disposition: A | Discharge status: Home / Self Care | Payer: BLUE CROSS/BLUE SHIELD | Attending: Emergency Medicine | Admitting: Emergency Medicine

## 2017-05-28 DIAGNOSIS — R079 Chest pain, unspecified: Secondary | ICD-10-CM

## 2017-05-28 DIAGNOSIS — R0789 Other chest pain: Secondary | ICD-10-CM | POA: Insufficient documentation

## 2017-05-28 DIAGNOSIS — R51 Headache: Secondary | ICD-10-CM | POA: Insufficient documentation

## 2017-05-28 DIAGNOSIS — R110 Nausea: Secondary | ICD-10-CM | POA: Insufficient documentation

## 2017-05-28 DIAGNOSIS — I10 Essential (primary) hypertension: Secondary | ICD-10-CM | POA: Insufficient documentation

## 2017-05-28 DIAGNOSIS — Z79899 Other long term (current) drug therapy: Secondary | ICD-10-CM | POA: Insufficient documentation

## 2017-05-28 DIAGNOSIS — R519 Headache, unspecified: Secondary | ICD-10-CM

## 2017-05-28 HISTORY — DX: Essential (primary) hypertension: I10

## 2017-05-28 LAB — CBC
HCT: 42.1 % (ref 35.0–47.0)
HEMOGLOBIN: 14.5 g/dL (ref 12.0–16.0)
MCH: 29.5 pg (ref 26.0–34.0)
MCHC: 34.6 g/dL (ref 32.0–36.0)
MCV: 85.2 fL (ref 80.0–100.0)
Platelets: 375 10*3/uL (ref 150–440)
RBC: 4.94 MIL/uL (ref 3.80–5.20)
RDW: 12.7 % (ref 11.5–14.5)
WBC: 10 10*3/uL (ref 3.6–11.0)

## 2017-05-28 LAB — BASIC METABOLIC PANEL
Anion gap: 7 (ref 5–15)
BUN: 6 mg/dL (ref 6–20)
CALCIUM: 9.1 mg/dL (ref 8.9–10.3)
CO2: 26 mmol/L (ref 22–32)
Chloride: 104 mmol/L (ref 101–111)
Creatinine, Ser: 0.68 mg/dL (ref 0.44–1.00)
GFR calc Af Amer: >60 mL/min (ref >60)
GLUCOSE: 92 mg/dL (ref 65–99)
Potassium: 3.5 mmol/L (ref 3.5–5.1)
Sodium: 137 mmol/L (ref 135–145)

## 2017-05-28 LAB — TROPONIN I

## 2017-05-28 MED ORDER — ONDANSETRON 4 MG PO TBDP
4.0000 mg | ORAL_TABLET | Freq: Once | ORAL | Status: DC
  Filled 2017-05-28: qty 1

## 2017-05-28 MED ORDER — ACETAMINOPHEN 325 MG PO TABS
650.0000 mg | ORAL_TABLET | Freq: Once | ORAL | Status: AC
  Administered 2017-05-28: 650 mg via ORAL
  Filled 2017-05-28: qty 2

## 2017-05-28 NOTE — Discharge Instructions (Signed)
Please return to the emergency department if you develop severe pain, shortness of breath, palpitations, lightheadedness or fainting, nausea or vomiting, fever, or any other symptoms concerning to you.

## 2017-05-28 NOTE — ED Triage Notes (Signed)
Pt comes into the ED via POV c/o chest pain that radiates into her back and is causing nausea.  Patient states she woke up feeling this way.  Patient in NAD at this time with even and unlabored respirations.  Patient denies any dizziness, diaphoresis, or shortness of breath.  Patient ambulatory to triage at this time.

## 2017-05-28 NOTE — ED Provider Notes (Signed)
[facility] Emergency Department Provider Note  ____________________________________________  Time seen: Approximately 3:33 PM  I have reviewed the triage vital signs and the nursing notes.   HISTORY  Chief Complaint Chest Pain    HPI Casey Hansen is a 26 y.o. female with a history of GERD, HTN, HL, morbid obesity presenting with chest pain. The patient states that she woke up and had a left-sidedpressure sensation radiating to the back associated with nausea. She did not have any shortness of breath, diaphoresis, vomiting. She has not expressed any other chest pain over the last several days. She denies EtOH or cocaine use. At this time, she also has a mild headache. She has not tried anything for her pain.   Past Medical History:  Diagnosis Date  . GERD (gastroesophageal reflux disease)   . Headache    Migraines/sinus  . Hyperlipidemia   . Hypertension     Patient Active Problem List   Diagnosis Date Noted  . Calculus of common duct   . Abnormal findings on imaging of biliary tract   . Choledocholithiasis with acute cholecystitis     Past Surgical History:  Procedure Laterality Date  . CHOLECYSTECTOMY N/A 04/22/2016   Procedure: LAPAROSCOPIC CHOLECYSTECTOMY WITH INTRAOPERATIVE CHOLANGIOGRAM;  Surgeon: Leafy Ro, MD;  Location: [facility] ORS;  Service: General;  Laterality: N/A;  . ENDOSCOPIC RETROGRADE CHOLANGIOPANCREATOGRAPHY (ERCP) WITH PROPOFOL N/A 04/24/2016   Procedure: ENDOSCOPIC RETROGRADE CHOLANGIOPANCREATOGRAPHY (ERCP) WITH PROPOFOL;  Surgeon: Midge Minium, MD;  Location: [facility] ENDOSCOPY;  Service: Endoscopy;  Laterality: N/A;  . ESOPHAGOGASTRODUODENOSCOPY (EGD) WITH PROPOFOL N/A 12/21/2015   Procedure: ESOPHAGOGASTRODUODENOSCOPY (EGD) WITH PROPOFOL;  Surgeon: Wallace Cullens, MD;  Location: [facility] END[facility]COPY;  Service: Gastroenterology;  Laterality: N/A;  . IMAGE GUIDED SINUS SURGERY N/A 12/26/2016   Procedure: IMAGE GUIDED SINUS SURGERY;  Surgeon:  Linus Salmons, MD;  Location: [facility];  Service: ENT;  Laterality: N/A;  . MAXILLARY ANTROSTOMY Left 12/26/2016   Procedure: MAXILLARY ANTROSTOMY;  Surgeon: Linus Salmons, MD;  Location: [facility];  Service: ENT;  Laterality: Left;  . SEPTOPLASTY N/A 12/26/2016   Procedure: SEPTOPLASTY;  Surgeon: Linus Salmons, MD;  Location: [facility];  Service: ENT;  Laterality: N/A;  . TURBINATE REDUCTION Bilateral 12/26/2016   Procedure: Frederik Schmidt REDUCTION;  Surgeon: Linus Salmons, MD;  Location: [facility];  Service: ENT;  Laterality: Bilateral;    Current Outpatient Rx  . Order #: [id number] Class: Historical Med  . Order #: [id number] Class: Historical Med  . Order #: [id number] Class: Print  . Order #: [id number] Class: Print    Allergies Patient has no known allergies.  Family History  Problem Relation Age of Onset  . Heart disease Mother   . Diabetes Father   . Hypertension Father   . Cancer Paternal Grandmother        breast    Social History Social History  Substance Use Topics  . Smoking status: Never Smoker  . Smokeless tobacco: Never Used  . Alcohol use No    Review of Systems Constitutional: No fever/chills.No lightheadedness or syncope. Eyes: No visual changes. ENT: No sore throat. No congestion or rhinorrhea. Cardiovascular: Positive chest pain. Denies palpitations. Respiratory: Denies shortness of breath.  No cough. Gastrointestinal: No abdominal pain.  Positive nausea, no vomiting.  No diarrhea.  No constipation. Genitourinary: Negative for dysuria. Musculoskeletal: Chest pain radiates to the back. Skin: Negative for rash. Neurological: Negative for headaches. No focal numbness, tingling or weakness.     ____________________________________________   PHYSICAL  EXAM:  VITAL SIGNS: ED Triage Vitals  Enc Vitals Group     BP 05/28/17 1306 (!) 141/101     Pulse Rate 05/28/17 1306 86     Resp 05/28/17 1306 16     Temp  05/28/17 1306 98.8 F (37.1 C)     Temp Source 05/28/17 1306 Oral     SpO2 05/28/17 1306 98 %     Weight 05/28/17 1303 230 lb (104.3 kg)     Height 05/28/17 1303 5\' 6"  (1.676 m)     Head Circumference --      Peak Flow --      Pain Score 05/28/17 1303 8     Pain Loc --      Pain Edu? --      Excl. in GC? --     Constitutional: Alert and oriented. Well appearing and in no acute distress. Answers questions appropriately. Eyes: Conjunctivae are normal.  EOMI. No scleral icterus. Head: Atraumatic. Nose: No congestion/rhinnorhea. Mouth/Throat: Mucous membranes are moist.  Neck: No stridor.  Supple.   Cardiovascular: Normal rate, regular rhythm. No murmurs, rubs or gallops.  Respiratory: Normal respiratory effort.  No accessory muscle use or retractions. Lungs CTAB.  No wheezes, rales or ronchi. Gastrointestinal: Soft, nontender and nondistended.  No guarding or rebound.  No peritoneal signs. Musculoskeletal: No LE edema. No ttp in the calves or palpable cords.  Negative Homan's sign. Neurologic:  A&Ox3.  Speech is clear.  Face and smile are symmetric.  EOMI.  Moves all extremities well. Skin:  Skin is warm, dry and intact. No rash noted. Psychiatric: Mood and affect are normal. Speech and behavior are normal.  Normal judgement.  ____________________________________________   LABS (all labs ordered are listed, but only abnormal results are displayed)  Labs Reviewed  BASIC METABOLIC PANEL  CBC  TROPONIN I   ____________________________________________  EKG  ED ECG REPORT I, Rockne Menghini, the attending physician, personally viewed and interpreted this ECG.   Date: 05/28/2017  EKG Time: 1303  Rate: 86  Rhythm: normal sinus rhythm  Axis: normal  Intervals:none  ST&T Change: No STEMI, no evidence of Brugada syndrome, prolonged QTC, or hypertrophy.  ____________________________________________  RADIOLOGY  Dg Chest 2 View  Result Date: 05/28/2017 CLINICAL DATA:   Chest pain EXAM: CHEST  2 VIEW COMPARISON:  None. FINDINGS: Normal heart size and mediastinal contours. No acute infiltrate or edema. No effusion or pneumothorax. No acute osseous findings. IMPRESSION: Negative chest. Electronically Signed   By: Marnee Spring M.D.   On: 05/28/2017 13:40    ____________________________________________   PROCEDURES  Procedure(s) performed: None  Procedures  Critical Care performed: No ____________________________________________   INITIAL IMPRESSION / ASSESSMENT AND PLAN / ED COURSE  Pertinent labs & imaging results that were available during my care of the patient were reviewed by me and considered in my medical decision making (see chart for details).  26 y.o. female who takes medications for hyperlipidemia, previously diagnosed with hypertension but not on any medications, presenting with chest pain radiating to the back associated with nausea. Overall, the patient is hypertensive here, but has otherwise stable vital signs. Her EKG does not show ischemic changes or arrhythmia. The patient's pain is atypical for ACS or MI, and her troponin is negative, so at this time, this is a very unlikely diagnosis. I do not think she has a PE. Her chest x-ray does not show pneumothorax or other acute cardiopulmonary process. It is possible that she has gas. We'll plan to  discharge the patient home with close PMD follow-up. She understand return precautions as well as follow-up instructions.  ____________________________________________  FINAL CLINICAL IMPRESSION(S) / ED DIAGNOSES  Final diagnoses:  Chest pain, unspecified type  Acute nonintractable headache, unspecified headache type         NEW MEDICATIONS STARTED DURING THIS VISIT:  New Prescriptions   No medications on file      Rockne MenghiniNorman, Anne-Caroline, MD 05/28/17 1538

## 2017-07-10 ENCOUNTER — Ambulatory Visit: Payer: BLUE CROSS/BLUE SHIELD | Admitting: Dietician

## 2017-08-11 ENCOUNTER — Ambulatory Visit: Payer: BLUE CROSS/BLUE SHIELD | Admitting: Dietician

## 2017-08-13 ENCOUNTER — Encounter: Payer: Self-pay | Admitting: Dietician

## 2017-11-09 DIAGNOSIS — S93409A Sprain of unspecified ligament of unspecified ankle, initial encounter: Secondary | ICD-10-CM

## 2017-11-09 HISTORY — DX: Sprain of unspecified ligament of unspecified ankle, initial encounter: S93.409A

## 2017-12-17 DIAGNOSIS — R002 Palpitations: Secondary | ICD-10-CM | POA: Insufficient documentation

## 2017-12-17 DIAGNOSIS — J45909 Unspecified asthma, uncomplicated: Secondary | ICD-10-CM | POA: Insufficient documentation

## 2018-01-29 ENCOUNTER — Other Ambulatory Visit: Payer: Self-pay | Admitting: *Deleted

## 2018-01-29 ENCOUNTER — Other Ambulatory Visit: Payer: Self-pay | Admitting: Nurse Practitioner

## 2018-01-29 DIAGNOSIS — N921 Excessive and frequent menstruation with irregular cycle: Secondary | ICD-10-CM

## 2018-01-29 DIAGNOSIS — I10 Essential (primary) hypertension: Secondary | ICD-10-CM | POA: Insufficient documentation

## 2018-02-03 ENCOUNTER — Ambulatory Visit: Payer: BLUE CROSS/BLUE SHIELD

## 2018-02-18 ENCOUNTER — Ambulatory Visit: Payer: BLUE CROSS/BLUE SHIELD

## 2018-02-19 ENCOUNTER — Ambulatory Visit
Admission: RE | Admit: 2018-02-19 | Discharge: 2018-02-19 | Disposition: A | Discharge status: Home / Self Care | Payer: BLUE CROSS/BLUE SHIELD | Source: Ambulatory Visit | Attending: Nurse Practitioner | Admitting: Nurse Practitioner

## 2018-02-19 DIAGNOSIS — N921 Excessive and frequent menstruation with irregular cycle: Secondary | ICD-10-CM | POA: Insufficient documentation

## 2018-06-11 ENCOUNTER — Emergency Department
Admission: EM | Admit: 2018-06-11 | Discharge: 2018-06-11 | Disposition: A | Discharge status: Home / Self Care | Payer: BLUE CROSS/BLUE SHIELD | Attending: Emergency Medicine | Admitting: Emergency Medicine

## 2018-06-11 ENCOUNTER — Other Ambulatory Visit: Payer: Self-pay

## 2018-06-11 ENCOUNTER — Emergency Department: Payer: BLUE CROSS/BLUE SHIELD

## 2018-06-11 DIAGNOSIS — R079 Chest pain, unspecified: Secondary | ICD-10-CM | POA: Insufficient documentation

## 2018-06-11 DIAGNOSIS — I10 Essential (primary) hypertension: Secondary | ICD-10-CM | POA: Insufficient documentation

## 2018-06-11 DIAGNOSIS — Z79899 Other long term (current) drug therapy: Secondary | ICD-10-CM | POA: Insufficient documentation

## 2018-06-11 DIAGNOSIS — R0789 Other chest pain: Secondary | ICD-10-CM

## 2018-06-11 LAB — CBC
HCT: 43.1 % (ref 35.0–47.0)
Hemoglobin: 14.9 g/dL (ref 12.0–16.0)
MCH: 30.2 pg (ref 26.0–34.0)
MCHC: 34.6 g/dL (ref 32.0–36.0)
MCV: 87.2 fL (ref 80.0–100.0)
PLATELETS: 340 10*3/uL (ref 150–440)
RBC: 4.95 MIL/uL (ref 3.80–5.20)
RDW: 13 % (ref 11.5–14.5)
WBC: 10.5 10*3/uL (ref 3.6–11.0)

## 2018-06-11 LAB — BASIC METABOLIC PANEL
ANION GAP: 7 (ref 5–15)
BUN: 11 mg/dL (ref 6–20)
CALCIUM: 9.1 mg/dL (ref 8.9–10.3)
CO2: 26 mmol/L (ref 22–32)
CREATININE: 0.64 mg/dL (ref 0.44–1.00)
Chloride: 104 mmol/L (ref 98–111)
GFR calc Af Amer: >60 mL/min (ref >60)
Glucose, Bld: 105 mg/dL — ABNORMAL HIGH (ref 70–99)
Potassium: 3.5 mmol/L (ref 3.5–5.1)
SODIUM: 137 mmol/L (ref 135–145)

## 2018-06-11 LAB — POCT PREGNANCY, URINE: PREG TEST UR: NEGATIVE

## 2018-06-11 LAB — TROPONIN I

## 2018-06-11 NOTE — Discharge Instructions (Addendum)
Take over-the-counter pain medications as needed, if you have increase in or change in your chest pain, shortness of breath, you feel worse in any way please return to the emergency department.  Follow close with primary care doctor.

## 2018-06-11 NOTE — ED Provider Notes (Signed)
[facility] Emergency Department Provider Note  ____________________________________________   I have reviewed the triage vital signs and the nursing notes. Where available I have reviewed prior notes and, if possible and indicated, outside hospital notes.    HISTORY  Chief Complaint Chest Pain    HPI Casey Hansen is a 27 y.o. female has a history of chronic noncardiac chest pain she states, has had stress test and Holter monitor for this.  Patient states that she does have a history of panic attacks but this feels different.  She states uninterruptedly for the last 3 weeks she has had left chest wall pain.  Worse when she moves her arm, changes position or touches it.  No other positional symptoms no exertional symptoms, no leg swelling, she states she is on a birth control, no recent travel, no leg swelling, no pleuritic chest pain, no family history and direct family members of a PE or DVT.  Patient states the pain is constant has been there every minute of the day for years no acute ever figured out but is been worse over the last 3 weeks.  She states she stopped picking up her child because it made the pain worse in her left chest.  Aching discomfort, no other radiation, see above      Past Medical History:  Diagnosis Date  . GERD (gastroesophageal reflux disease)   . Headache    Migraines/sinus  . Hyperlipidemia   . Hypertension     Patient Active Problem List   Diagnosis Date Noted  . Calculus of common duct   . Abnormal findings on imaging of biliary tract   . Choledocholithiasis with acute cholecystitis     Past Surgical History:  Procedure Laterality Date  . CHOLECYSTECTOMY N/A 04/22/2016   Procedure: LAPAROSCOPIC CHOLECYSTECTOMY WITH INTRAOPERATIVE CHOLANGIOGRAM;  Surgeon: Leafy Roiego F Pabon, MD;  Location: [facility] ORS;  Service: General;  Laterality: N/A;  . ENDOSCOPIC RETROGRADE CHOLANGIOPANCREATOGRAPHY (ERCP) WITH PROPOFOL N/A 04/24/2016   Procedure: ENDOSCOPIC RETROGRADE CHOLANGIOPANCREATOGRAPHY (ERCP) WITH PROPOFOL;  Surgeon: Midge Miniumarren Wohl, MD;  Location: [facility] ENDOSCOPY;  Service: Endoscopy;  Laterality: N/A;  . ESOPHAGOGASTRODUODENOSCOPY (EGD) WITH PROPOFOL N/A 12/21/2015   Procedure: ESOPHAGOGASTRODUODENOSCOPY (EGD) WITH PROPOFOL;  Surgeon: Wallace CullensPaul Y Oh, MD;  [facility];  Service: Gastroenterology;  Laterality: N/A;  . IMAGE GUIDED SINUS SURGERY N/A 12/26/2016   Procedure: IMAGE GUIDED SINUS SURGERY;  Surgeon: Linus Salmonshapman McQueen, MD;  [facility];  Service: ENT;  Laterality: N/A;  . MAXILLARY ANTROSTOMY Left 12/26/2016   Procedure: MAXILLARY ANTROSTOMY;  Surgeon: Linus Salmonshapman McQueen, MD;  [facility];  Service: ENT;  Laterality: Left;  . SEPTOPLASTY N/A 12/26/2016   Procedure: SEPTOPLASTY;  Surgeon: Linus Salmonshapman McQueen, MD;  [facility];  Service: ENT;  Laterality: N/A;  . TURBINATE REDUCTION Bilateral 12/26/2016   Procedure: Frederik SchmidtURBINATE REDUCTION;  Surgeon: Linus Salmonshapman McQueen, MD;  [facility];  Service: ENT;  Laterality: Bilateral;    Prior to Admission medications   Medication Sig Start Date End Date Taking? Authorizing Provider  atorvastatin (LIPITOR) 20 MG tablet Take 20 mg by mouth daily at 6 PM.    Yes [provider]  hydrOXYzine (ATARAX/VISTARIL) 25 MG tablet Take 12.5-25 mg by mouth every 12 (twelve) hours as needed for anxiety. 05/19/18  Yes [provider]  metoprolol succinate (TOPROL-XL) 100 MG 24 hr tablet Take 100 mg by mouth daily. 05/24/18  Yes [provider]  omeprazole (PRILOSEC) 20 MG capsule Take 20 mg by  mouth 2 (two) times daily before a meal.    Yes [provider]  Venlafaxine HCl 75 MG TB24 Take 75 mg by mouth daily. 05/19/18  Yes [provider]  oxyCODONE-acetaminophen (ROXICET) 5-325 MG tablet Take 1-2 tablets by mouth every 4 (four) hours as needed for severe pain. Patient not taking: Reported on  06/11/2018 12/26/16   Linus Salmons, MD    Allergies Patient has no known allergies.  Family History  Problem Relation Age of Onset  . Heart disease Mother   . Diabetes Father   . Hypertension Father   . Cancer Paternal Grandmother        breast    Social History Social History   Tobacco Use  . Smoking status: Never Smoker  . Smokeless tobacco: Never Used  Substance Use Topics  . Alcohol use: No  . Drug use: No    Review of Systems Constitutional: No fever/chills Eyes: No visual changes. ENT: No sore throat. No stiff neck no neck pain Cardiovascular: See HPI regarding chest pain. Respiratory: Denies shortness of breath. Gastrointestinal:   no vomiting.  No diarrhea.  No constipation. Genitourinary: Negative for dysuria. Musculoskeletal: Negative lower extremity swelling Skin: Negative for rash. Neurological: Negative for severe headaches, focal weakness or numbness.   ____________________________________________   PHYSICAL EXAM:  VITAL SIGNS: ED Triage Vitals  Enc Vitals Group     BP 06/11/18 1910 134/86     Pulse Rate 06/11/18 1910 76     Resp 06/11/18 1910 17     Temp 06/11/18 1910 98.2 F (36.8 C)     Temp src --      SpO2 06/11/18 1910 99 %     Weight 06/11/18 1911 240 lb (108.9 kg)     Height 06/11/18 1911 5\' 5"  (1.651 m)     Head Circumference --      Peak Flow --      Pain Score 06/11/18 1910 8     Pain Loc --      Pain Edu? --      Excl. in GC? --     Constitutional: Alert and oriented. Well appearing and in no acute distress. Eyes: Conjunctivae are normal Head: Atraumatic HEENT: No congestion/rhinnorhea. Mucous membranes are moist.  Oropharynx non-erythematous Neck:   Nontender with no meningismus, no masses, no stridor Cardiovascular: Normal rate, regular rhythm. Grossly normal heart sounds.  Good peripheral circulation. Respiratory: Normal respiratory effort.  No retractions. Lungs CTAB. Chest: Female chaperone present, tender  palpation left chest wall to touch the area the pectoralis muscle, on the left above the left breast she states "ouch that the pain right there" and pulls back.  There is no crepitus there is no flail chest. Abdominal: Soft and nontender. No distention. No guarding no rebound Back:  There is no focal tenderness or step off.  there is no midline tenderness there are no lesions noted. there is no CVA tenderness Musculoskeletal: No lower extremity tenderness, no upper extremity tenderness. No joint effusions, no DVT signs strong distal pulses no edema Neurologic:  Normal speech and language. No gross focal neurologic deficits are appreciated.  Skin:  Skin is warm, dry and intact. No rash noted. Psychiatric: Mood and affect are normal. Speech and behavior are normal.  ____________________________________________   LABS (all labs ordered are listed, but only abnormal results are displayed)  Labs Reviewed  BASIC METABOLIC PANEL - Abnormal; Notable for the following components:      Result Value   Glucose, Bld  105 (*)    All other components within normal limits  CBC  TROPONIN I  POCT PREGNANCY, URINE  POC URINE PREG, ED    Pertinent labs  results that were available during my care of the patient were reviewed by me and considered in my medical decision making (see chart for details). ____________________________________________  EKG  I personally interpreted any EKGs ordered by me or triage EKG shows no acute ST elevation or depression no acute ischemic changes, nonspecific ST changes, as are prime configuration, no change of any significance from June 2018. ____________________________________________  RADIOLOGY  Pertinent labs & imaging results that were available during my care of the patient were reviewed by me and considered in my medical decision making (see chart for details). If possible, patient and/or family made aware of any abnormal findings.  Dg Chest 2 View  Result  Date: 06/11/2018 CLINICAL DATA:  Left-sided chest pain for 2 days EXAM: CHEST - 2 VIEW COMPARISON:  05/28/2017 FINDINGS: The heart size and mediastinal contours are within normal limits. Both lungs are clear. The visualized skeletal structures are unremarkable. IMPRESSION: No active cardiopulmonary disease. Electronically Signed   By: Alcide Clever M.D.   On: 06/11/2018 19:29   ____________________________________________    PROCEDURES  Procedure(s) performed: None  Procedures  Critical Care performed: None  ____________________________________________   INITIAL IMPRESSION / ASSESSMENT AND PLAN / ED COURSE  Pertinent labs & imaging results that were available during my care of the patient were reviewed by me and considered in my medical decision making (see chart for details).  Patient with 3 weeks of chronic discomfort which is very reproducible, actually is been going on for years.  Work-up here is very reassuring.  She is PERC negative, she has she is negative for risk factors for PE and does not I do not think in this case require imaging for that.  She has had extensive work-up for this.  Nonetheless we did do cardiac enzymes which are negative.  Abdomen is benign she has no history of food related pain in her belly does not be training surgical pathology today.  She has had her gallbladder out.  This could be GI mediated but it again very reproducible chest wall pain in the pectoralis muscle.  At this time I do not think any indication for admission exists.  At this time, there does not appear to be clinical evidence to support the diagnosis of pulmonary embolus, dissection, myocarditis, endocarditis, pericarditis, pericardial tamponade, acute coronary syndrome, pneumothorax, pneumonia, or any other acute intrathoracic pathology that will require admission or acute intervention. Nor is there evidence of any significant intra-abdominal pathology causing this discomfort.  Patient very  reassured by her work-up, we will have her follow closely as an outpatient with cardiology as a precaution well as PCP.  Return precautions given and understood.    ____________________________________________   FINAL CLINICAL IMPRESSION(S) / ED DIAGNOSES  Final diagnoses:  None      This chart was dictated using voice recognition software.  Despite best efforts to proofread,  errors can occur which can change meaning.       Jeanmarie Plant, MD 06/11/18 2010

## 2018-06-11 NOTE — ED Triage Notes (Signed)
Pt arrives to ED via POV with c/o left-sided CP x2 days with radiation into the left shoulder. Pt reports some SHOB, but denies N/V or diaphoresis. Pt reports h/x of palpitations and cardiomegaly. Pt is A&O, in NAD; RR even, regular, and unlabored.

## 2019-01-31 ENCOUNTER — Ambulatory Visit: Payer: BLUE CROSS/BLUE SHIELD | Admitting: Gastroenterology

## 2019-02-18 NOTE — Progress Notes (Deleted)
Casey Hansen [address]  [address]  [place], [place] [address]  Main: [phone]  Fax: [phone]   Gastroenterology Consultation  Referring Provider:     Fayrene Helper, NP Primary Care Physician:  Fayrene Helper, NP Reason for Consultation:     History of cholecystectomy, urgent bowel movements after eating        HPI:   ***  Casey Hansen is a 28 y.o. y/o female referred for consultation & management  by Dr. Fayrene Helper, NP.    Patient has previously been seen by [facility], Dr. Bluford Kaufmann in January 2017.  As per their notes, patient was started on Protonix, and dicyclomine due to abdominal cramping and diarrhea.  Imodium for as needed diarrhea was also given.  They started her on Levsin as dicyclomine and Imodium only had minimal benefit.  Patient had an EGD with Dr. Bluford Kaufmann in January 2017 for epigastric abdominal pain and reflux.  Endoscopic findings were normal.  Gastric biopsies were negative for H. pylori.  Chronic gastritis was reported.  GE junction biopsies showed reflux gastro esophagitis.  Negative for intestinal metaplasia.  Patient had an ERCP in May 2017 due to choledocholithiasis which found stones in the common bile duct which were removed and sphincterotomy was was performed. She had a cholecystectomy in May 2017 which showed chronic cholecystitis, cholelithiasis and cholesterolosis on pathology.  Past Medical History:  Diagnosis Date  . GERD (gastroesophageal reflux disease)   . Headache    Migraines/sinus  . Hyperlipidemia   . Hypertension     Past Surgical History:  Procedure Laterality Date  . CHOLECYSTECTOMY N/A 04/22/2016   Procedure: LAPAROSCOPIC CHOLECYSTECTOMY WITH INTRAOPERATIVE CHOLANGIOGRAM;  Surgeon: Leafy Ro, MD;  Location: [facility] ORS;  Service: General;  Laterality: N/A;  . ENDOSCOPIC RETROGRADE CHOLANGIOPANCREATOGRAPHY (ERCP) WITH PROPOFOL N/A 04/24/2016   Procedure: ENDOSCOPIC RETROGRADE CHOLANGIOPANCREATOGRAPHY  (ERCP) WITH PROPOFOL;  Surgeon: Midge Minium, MD;  Location: [facility] ENDOSCOPY;  Service: Endoscopy;  Laterality: N/A;  . ESOPHAGOGASTRODUODENOSCOPY (EGD) WITH PROPOFOL N/A 12/21/2015   Procedure: ESOPHAGOGASTRODUODENOSCOPY (EGD) WITH PROPOFOL;  Surgeon: Wallace Cullens, MD;  Location: [facility] ENDOSCOPY;  Service: Gastroenterology;  Laterality: N/A;  . IMAGE GUIDED SINUS SURGERY N/A 12/26/2016   Procedure: IMAGE GUIDED SINUS SURGERY;  Surgeon: Linus Salmons, MD;  Location: [facility];  Service: ENT;  Laterality: N/A;  . MAXILLARY ANTROSTOMY Left 12/26/2016   Procedure: MAXILLARY ANTROSTOMY;  Surgeon: Linus Salmons, MD;  Location: [facility];  Service: ENT;  Laterality: Left;  . SEPTOPLASTY N/A 12/26/2016   Procedure: SEPTOPLASTY;  Surgeon: Linus Salmons, MD;  Location: [facility];  Service: ENT;  Laterality: N/A;  . TURBINATE REDUCTION Bilateral 12/26/2016   Procedure: Frederik Schmidt REDUCTION;  Surgeon: Linus Salmons, MD;  Location: [facility];  Service: ENT;  Laterality: Bilateral;    Prior to Admission medications   Medication Sig Start Date End Date Taking? Authorizing Provider  atorvastatin (LIPITOR) 20 MG tablet Take 20 mg by mouth daily at 6 PM.     [provider]  hydrOXYzine (ATARAX/VISTARIL) 25 MG tablet Take 12.5-25 mg by mouth every 12 (twelve) hours as needed for anxiety. 05/19/18   [provider]  metoprolol succinate (TOPROL-XL) 100 MG 24 hr tablet Take 100 mg by mouth daily. 05/24/18   [provider]  omeprazole (PRILOSEC) 20 MG capsule Take 20 mg by mouth 2 (two) times daily before a meal.     [provider]  oxyCODONE-acetaminophen (ROXICET) 5-325  MG tablet Take 1-2 tablets by mouth every 4 (four) hours as needed for severe pain. Patient not taking: Reported on 06/11/2018 12/26/16   Linus Salmons, MD  Venlafaxine HCl 75 MG TB24 Take 75 mg by mouth daily. 05/19/18   [provider]    Family History  Problem  Relation Age of Onset  . Heart disease Mother   . Diabetes Father   . Hypertension Father   . Cancer Paternal Grandmother        breast     Social History   Tobacco Use  . Smoking status: Never Smoker  . Smokeless tobacco: Never Used  Substance Use Topics  . Alcohol use: No  . Drug use: No    Allergies as of 02/21/2019  . (No Known Allergies)    Review of Systems:    All systems reviewed and negative except where noted in HPI.   Physical Exam:  *** No LMP recorded. Psych:  Alert and cooperative. Normal mood and affect. General:   Alert,  Well-developed, well-nourished, pleasant and cooperative in NAD Head:  Normocephalic and atraumatic. Eyes:  Sclera clear, no icterus.   Conjunctiva pink. Ears:  Normal auditory acuity. Nose:  No deformity, discharge, or lesions. Mouth:  No deformity or lesions,oropharynx pink & moist. Neck:  Supple; no masses or thyromegaly. Abdomen:  Normal bowel sounds.  No bruits.  Soft, non-tender and non-distended without masses, hepatosplenomegaly or hernias noted.  No guarding or rebound tenderness.    Msk:  Symmetrical without gross deformities. Good, equal movement & strength bilaterally. Pulses:  Normal pulses noted. Extremities:  No clubbing or edema.  No cyanosis. Neurologic:  Alert and oriented x3;  grossly normal neurologically. Skin:  Intact without significant lesions or rashes. No jaundice. Lymph Nodes:  No significant cervical adenopathy. Psych:  Alert and cooperative. Normal mood and affect.   Labs: CBC    Component Value Date/Time   WBC 10.5 06/11/2018 1914   RBC 4.95 06/11/2018 1914   HGB 14.9 06/11/2018 1914   HGB 12.2 11/20/2012 2109   HCT 43.1 06/11/2018 1914   HCT 34.7 (L) 11/23/2012 0521   PLT 340 06/11/2018 1914   PLT 322 11/20/2012 2109   MCV 87.2 06/11/2018 1914   MCV 90 11/20/2012 2109   MCH 30.2 06/11/2018 1914   MCHC 34.6 06/11/2018 1914   RDW 13.0 06/11/2018 1914   RDW 13.1 11/20/2012 2109   LYMPHSABS  3.3 11/20/2012 2109   MONOABS 1.2 (H) 11/20/2012 2109   EOSABS 0.1 11/20/2012 2109   BASOSABS 0.0 11/20/2012 2109   CMP     Component Value Date/Time   NA 137 06/11/2018 1914   NA 143 10/22/2012 2140   K 3.5 06/11/2018 1914   K 3.5 10/22/2012 2140   CL 104 06/11/2018 1914   CL 106 10/22/2012 2140   CO2 26 06/11/2018 1914   CO2 25 10/22/2012 2140   GLUCOSE 105 (H) 06/11/2018 1914   GLUCOSE 80 10/22/2012 2140   BUN 11 06/11/2018 1914   BUN 3 (L) 10/22/2012 2140   CREATININE 0.64 06/11/2018 1914   CREATININE 0.50 (L) 10/22/2012 2140   CALCIUM 9.1 06/11/2018 1914   CALCIUM 8.7 10/22/2012 2140   PROT 6.6 04/23/2016 0429   PROT 6.6 10/22/2012 2140   ALBUMIN 3.5 04/23/2016 0429   ALBUMIN 2.6 (L) 10/22/2012 2140   AST 190 (H) 04/23/2016 0429   AST 84 (H) 10/22/2012 2140   ALT 268 (H) 04/23/2016 0429   ALT 62 10/22/2012 2140  ALKPHOS 168 (H) 04/23/2016 0429   ALKPHOS 232 (H) 10/22/2012 2140   BILITOT 0.8 04/23/2016 0429   BILITOT 0.5 10/22/2012 2140   GFRNONAA >60 06/11/2018 1914   GFRNONAA >60 10/22/2012 2140   GFRAA >60 06/11/2018 1914   GFRAA >60 10/22/2012 2140    Imaging Studies: No results found.  Assessment and Plan:   LEGACY CARRENDER is a 28 y.o. y/o female has been referred for ***    Dr Casey Hansen  Speech recognition software was used to dictate the above note.

## 2019-02-21 ENCOUNTER — Encounter: Payer: Self-pay | Admitting: Gastroenterology

## 2019-02-21 ENCOUNTER — Ambulatory Visit: Payer: BLUE CROSS/BLUE SHIELD | Admitting: Gastroenterology

## 2019-05-10 ENCOUNTER — Other Ambulatory Visit: Payer: Self-pay | Admitting: Nurse Practitioner

## 2019-05-10 DIAGNOSIS — R922 Inconclusive mammogram: Secondary | ICD-10-CM

## 2019-05-10 DIAGNOSIS — N644 Mastodynia: Secondary | ICD-10-CM

## 2019-05-12 ENCOUNTER — Other Ambulatory Visit: Payer: Self-pay | Admitting: Nurse Practitioner

## 2019-05-12 DIAGNOSIS — N644 Mastodynia: Secondary | ICD-10-CM

## 2019-05-12 DIAGNOSIS — R922 Inconclusive mammogram: Secondary | ICD-10-CM

## 2019-05-12 DIAGNOSIS — R9230 Dense breasts, unspecified: Secondary | ICD-10-CM

## 2019-05-16 ENCOUNTER — Ambulatory Visit
Admission: RE | Admit: 2019-05-16 | Discharge: 2019-05-16 | Disposition: A | Discharge status: Home / Self Care | Payer: BLUE CROSS/BLUE SHIELD | Source: Ambulatory Visit | Attending: Nurse Practitioner | Admitting: Nurse Practitioner

## 2019-05-16 ENCOUNTER — Other Ambulatory Visit: Payer: Self-pay

## 2019-05-16 DIAGNOSIS — N644 Mastodynia: Secondary | ICD-10-CM | POA: Diagnosis present

## 2019-05-16 DIAGNOSIS — R922 Inconclusive mammogram: Secondary | ICD-10-CM | POA: Diagnosis present

## 2019-07-19 ENCOUNTER — Other Ambulatory Visit: Payer: Self-pay

## 2019-07-19 ENCOUNTER — Ambulatory Visit: Payer: BC Managed Care – PPO | Admitting: Obstetrics and Gynecology

## 2019-07-19 ENCOUNTER — Encounter: Payer: Self-pay | Admitting: Obstetrics and Gynecology

## 2019-07-19 VITALS — BP 137/81 | HR 72 | Ht 65.0 in | Wt 261.0 lb

## 2019-07-19 DIAGNOSIS — N938 Other specified abnormal uterine and vaginal bleeding: Secondary | ICD-10-CM

## 2019-07-19 DIAGNOSIS — R102 Pelvic and perineal pain: Secondary | ICD-10-CM | POA: Diagnosis not present

## 2019-07-19 MED ORDER — NORETHINDRONE ACETATE 5 MG PO TABS: ORAL_TABLET | ORAL | 0 refills | Status: DC

## 2019-07-19 NOTE — Progress Notes (Signed)
HPI:      [name] is a 28 y.o. No obstetric history on file. who LMP was Patient's last menstrual period was 06/07/2019.  Subjective:   She presents today with complaints of 20 days of vaginal bleeding.  She states that at times it is very heavy.  Prior to this she was having normal regular monthly menses.  Her menses were lasting approximately 3 days.  She began bleeding heavily and it continued.  Her primary care physician placed her on Provera for 10 days, then her bleeding returned and she was placed on another 10 days of Provera without success.  She continues to have intermittent bleeding daily. She uses withdrawal method for birth control but is considering other forms of birth control.(She has been successful using withdrawal for more than 6 years.)  Of significant note an ultrasound from 2019 revealed a normal pelvis.    Hx: The following portions of the patient's history were reviewed and updated as appropriate:             She  has a past medical history of GERD (gastroesophageal reflux disease), Headache, Hyperlipidemia, and Hypertension. She does not have any pertinent problems on file. She  has a past surgical history that includes Esophagogastroduodenoscopy (egd) with propofol (N/A, 12/21/2015); Cholecystectomy (N/A, 04/22/2016); Endoscopic retrograde cholangiopancreatography (ercp) with propofol (N/A, 04/24/2016); Image guided sinus surgery (N/A, 12/26/2016); Septoplasty (N/A, 12/26/2016); Maxillary antrostomy (Left, 12/26/2016); and Turbinate reduction (Bilateral, 12/26/2016). Her family history includes Breast cancer in her paternal aunt and paternal grandmother; Cancer in her paternal grandmother; Diabetes in her father; Heart disease in her mother; Hypertension in her father. She  reports that she has never smoked. She has never used smokeless tobacco. She reports that she does not drink alcohol or use drugs. She has a current medication list which includes the following  prescription(s): hydroxyzine, omeprazole, venlafaxine hcl, metoprolol succinate, and rosuvastatin. She has No Known Allergies.       Review of Systems:  Review of Systems  Constitutional: Denied constitutional symptoms, night sweats, recent illness, fatigue, fever, insomnia and weight loss.  Eyes: Denied eye symptoms, eye pain, photophobia, vision change and visual disturbance.  Ears/Nose/Throat/Neck: Denied ear, nose, throat or neck symptoms, hearing loss, nasal discharge, sinus congestion and sore throat.  Cardiovascular: Denied cardiovascular symptoms, arrhythmia, chest pain/pressure, edema, exercise intolerance, orthopnea and palpitations.  Respiratory: Denied pulmonary symptoms, asthma, pleuritic pain, productive sputum, cough, dyspnea and wheezing.  Gastrointestinal: Denied, gastro-esophageal reflux, melena, nausea and vomiting.  Genitourinary: See HPI for additional information.  Musculoskeletal: Denied musculoskeletal symptoms, stiffness, swelling, muscle weakness and myalgia.  Dermatologic: Denied dermatology symptoms, rash and scar.  Neurologic: Denied neurology symptoms, dizziness, headache, neck pain and syncope.  Psychiatric: Denied psychiatric symptoms, anxiety and depression.  Endocrine: Denied endocrine symptoms including hot flashes and night sweats.   Meds:   Current Outpatient Medications on File Prior to Visit  Medication Sig Dispense Refill  . hydrOXYzine (ATARAX/VISTARIL) 25 MG tablet Take 12.5-25 mg by mouth every 12 (twelve) hours as needed for anxiety.  1  . omeprazole (PRILOSEC) 20 MG capsule Take 20 mg by mouth 2 (two) times daily before a meal.     . Venlafaxine HCl 75 MG TB24 Take 75 mg by mouth daily.  1  . metoprolol succinate (TOPROL-XL) 100 MG 24 hr tablet Take 100 mg by mouth daily.  1  . rosuvastatin (CRESTOR) 10 MG tablet TAKE 1 TABLET BY MOUTH NIGHTLY AT BEDTIME FOR HIGH CHOLESTEROL (STOP ATORVASTATIN)  No current facility-administered medications  on file prior to visit.     Objective:     Vitals:   07/19/19 0842  BP: 137/81  Pulse: 72                Assessment:    No obstetric history on file. Patient Active Problem List   Diagnosis Date Noted  . Calculus of common duct   . Abnormal findings on imaging of biliary tract   . Choledocholithiasis with acute cholecystitis      1. Dysfunctional uterine bleeding   2. Pelvic pain in female     It is possible this is just an irregular cycle where she did not ovulate or possible that she has a right ovarian cyst (she has some moderate pain in this area) that has prolonged her cycle.   Plan:            1.  Ultrasound to rule out ovarian cyst/pelvic pathology  2.  Aygestin to control menses for 1 month then expect return to regular cycle.  3.  Additional forms of birth control discussed with the added benefit of cycle control.  Patient will consider these-especially IUD-in the future. Orders Orders Placed This Encounter  Procedures  . US PELVIS (TRANSABDOMINAL ONLY)    No orders of the defined types were placed in this encounter.     F/U  Return in about 6 weeks (around 08/30/2019). I spent 31 minutes involved in the care of this patient of which greater than 50% was spent discussing pelvic pain, dysfunctional bleeding, ovarian cyst, methods of hormonal cycle control including IUD and birth control pills, use of progesterone for cycle control.  All questions answered.  Elonda Huskyavid J. Shauntel Prest, M.D. 07/19/2019 9:11 AM

## 2019-07-19 NOTE — Progress Notes (Signed)
Patient comes in today for irregular cycles. She has been bleeding since June 23rd  2020. She is having some pelvic pain with it as well.

## 2019-07-20 ENCOUNTER — Ambulatory Visit (INDEPENDENT_AMBULATORY_CARE_PROVIDER_SITE_OTHER): Payer: BC Managed Care – PPO

## 2019-07-20 ENCOUNTER — Telehealth: Payer: Self-pay | Admitting: Obstetrics and Gynecology

## 2019-07-20 DIAGNOSIS — R102 Pelvic and perineal pain: Secondary | ICD-10-CM

## 2019-07-20 NOTE — Telephone Encounter (Signed)
Notified patient of US results. Patient verbalized understanding.  

## 2019-07-20 NOTE — Telephone Encounter (Signed)
Spoke with patient and she stated that when she had the [place] the tech was pressing on her stomach so this made her bleed more. She started passing clots. She said that she had been passing clots before the [place] as well. She has not picked up the medication that was prescribed to her yesterday yet. I told her to go ahead and pick up the medication and start. I told her to give it at least a week to get in her system. She did say that with passing the clots she wasn't feeling well. I told her to try to take it easy the rest of the day to see if this helps. I told her that after he reviews [place] I will give her a call back.

## 2019-07-20 NOTE — Telephone Encounter (Signed)
Patient had an ultrasound today. She seemed upset and was crying afterwards and requested to speak to Dr Amalia Hailey nurse but you had already left for lunch. She would like a call back if at all possible.Thanks

## 2019-08-08 ENCOUNTER — Telehealth: Payer: Self-pay | Admitting: Obstetrics and Gynecology

## 2019-08-08 NOTE — Telephone Encounter (Signed)
The patient called and stated that she was on medication and not she is starting to bleed again, Pt has questions and concerns to discuss with her nurse. No other information was disclosed. Please advise.

## 2019-08-08 NOTE — Telephone Encounter (Signed)
LM for patient to return call.

## 2019-08-11 NOTE — Telephone Encounter (Signed)
LM for patient to return call.

## 2019-08-30 ENCOUNTER — Encounter: Payer: BC Managed Care – PPO | Admitting: Obstetrics and Gynecology

## 2019-11-01 DIAGNOSIS — E669 Obesity, unspecified: Secondary | ICD-10-CM | POA: Insufficient documentation

## 2019-11-01 DIAGNOSIS — Z6841 Body Mass Index (BMI) 40.0 and over, adult: Secondary | ICD-10-CM | POA: Insufficient documentation

## 2019-11-18 DIAGNOSIS — E559 Vitamin D deficiency, unspecified: Secondary | ICD-10-CM | POA: Insufficient documentation

## 2020-04-23 ENCOUNTER — Ambulatory Visit: Payer: BC Managed Care – PPO | Attending: Nurse Practitioner

## 2020-04-25 ENCOUNTER — Ambulatory Visit: Payer: BC Managed Care – PPO

## 2020-05-01 ENCOUNTER — Ambulatory Visit: Payer: BC Managed Care – PPO

## 2020-05-03 ENCOUNTER — Ambulatory Visit: Payer: BC Managed Care – PPO

## 2020-05-08 ENCOUNTER — Ambulatory Visit: Payer: BC Managed Care – PPO

## 2020-06-14 ENCOUNTER — Ambulatory Visit: Payer: BC Managed Care – PPO

## 2020-06-19 ENCOUNTER — Ambulatory Visit: Payer: BC Managed Care – PPO

## 2020-06-21 ENCOUNTER — Ambulatory Visit: Payer: BC Managed Care – PPO

## 2020-09-11 IMAGING — MG DIGITAL DIAGNOSTIC BILATERAL MAMMOGRAM WITH TOMO AND CAD
6 of 10 series · 6 of 30 positions shown · non-contrast
Comparison: None.

CLINICAL DATA: 28-year-old female complaining of thickening and
pain in the upper-inner quadrant of the right breast.

EXAM:
DIGITAL DIAGNOSTIC BILATERAL MAMMOGRAM WITH CAD AND TOMO
ULTRASOUND RIGHT BREAST

[R CC synth-2D]
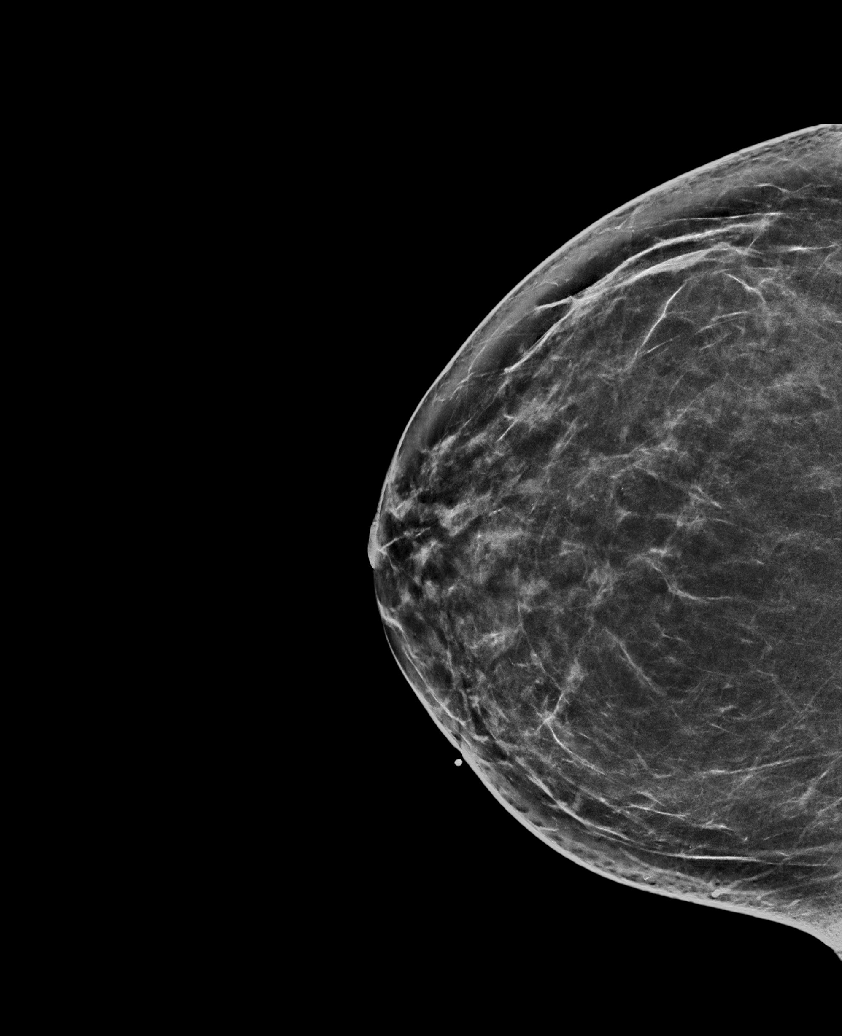

[L MLO synth-2D]
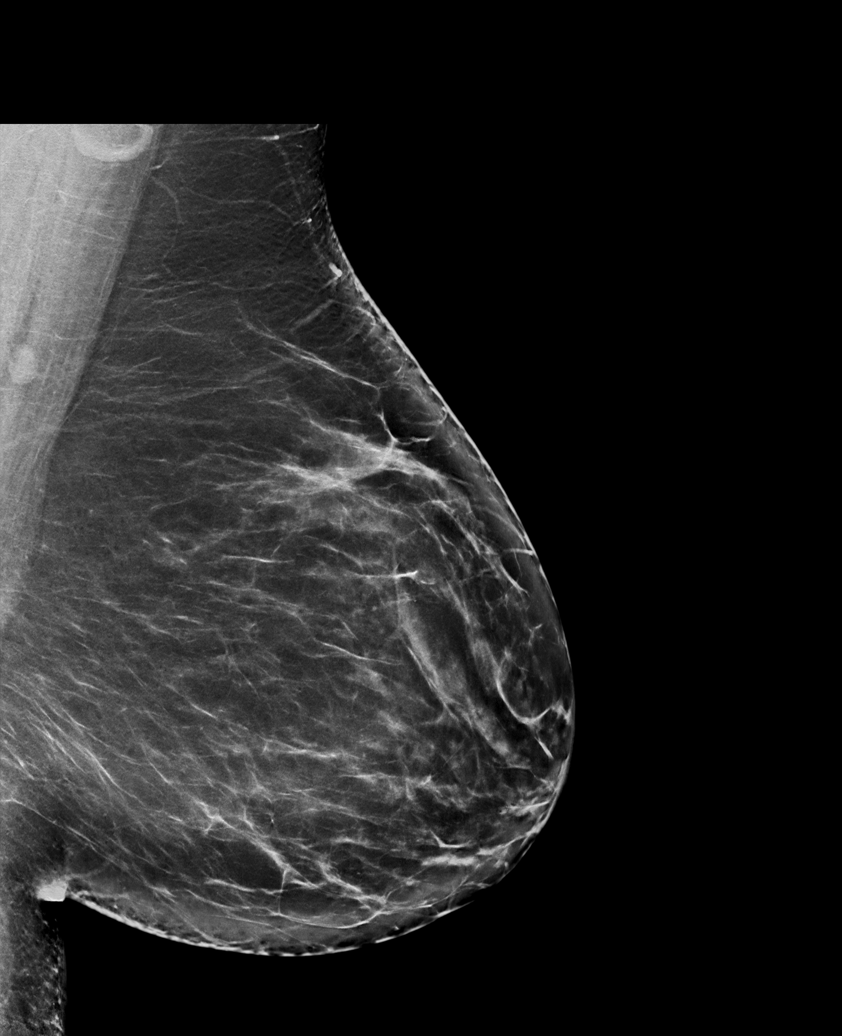

[R MLO synth-2D]
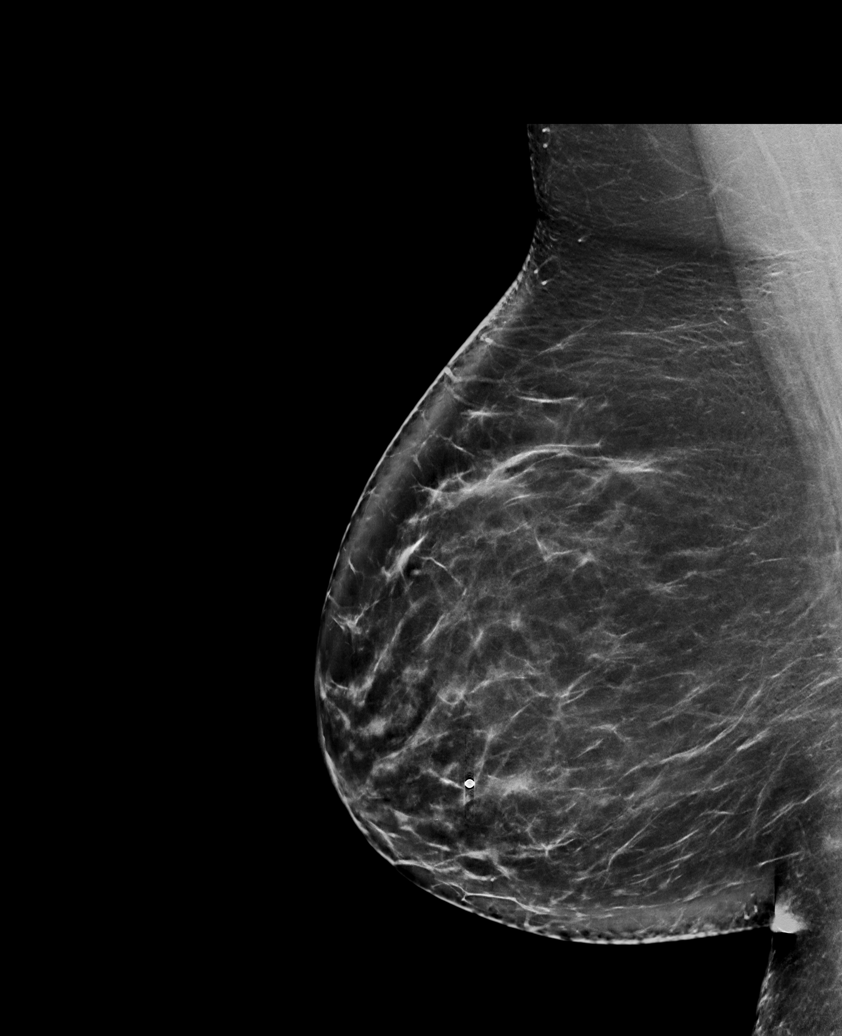

[R TAN synth-2D]
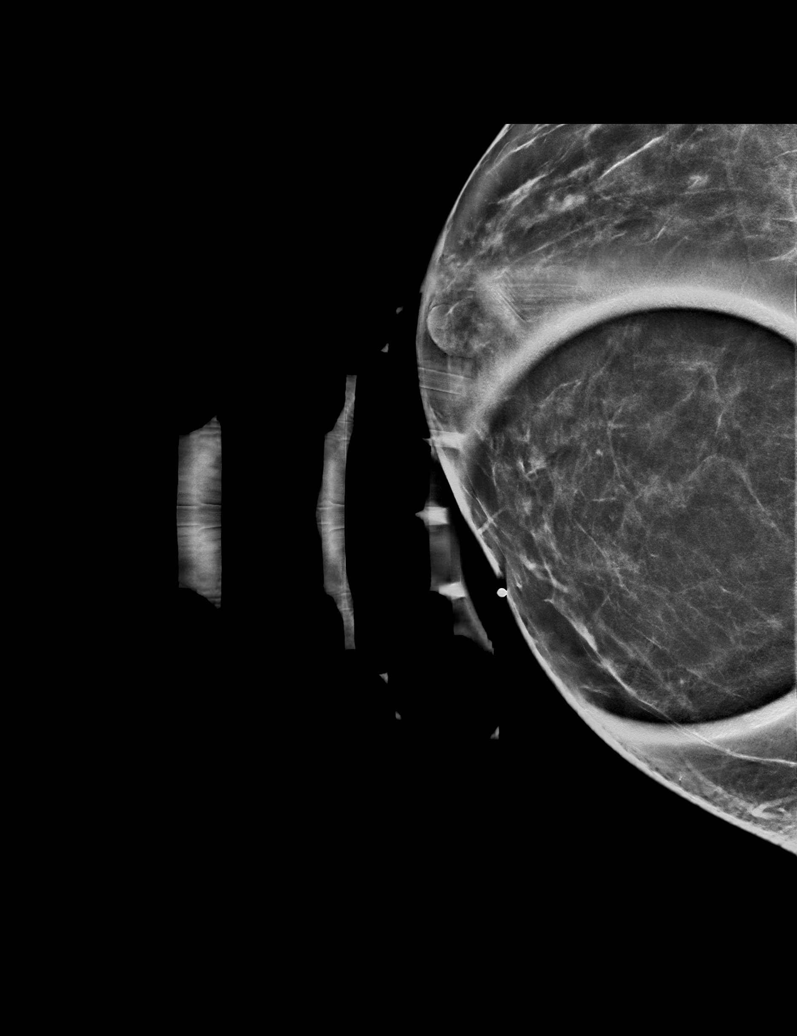

[L CC synth-2D]
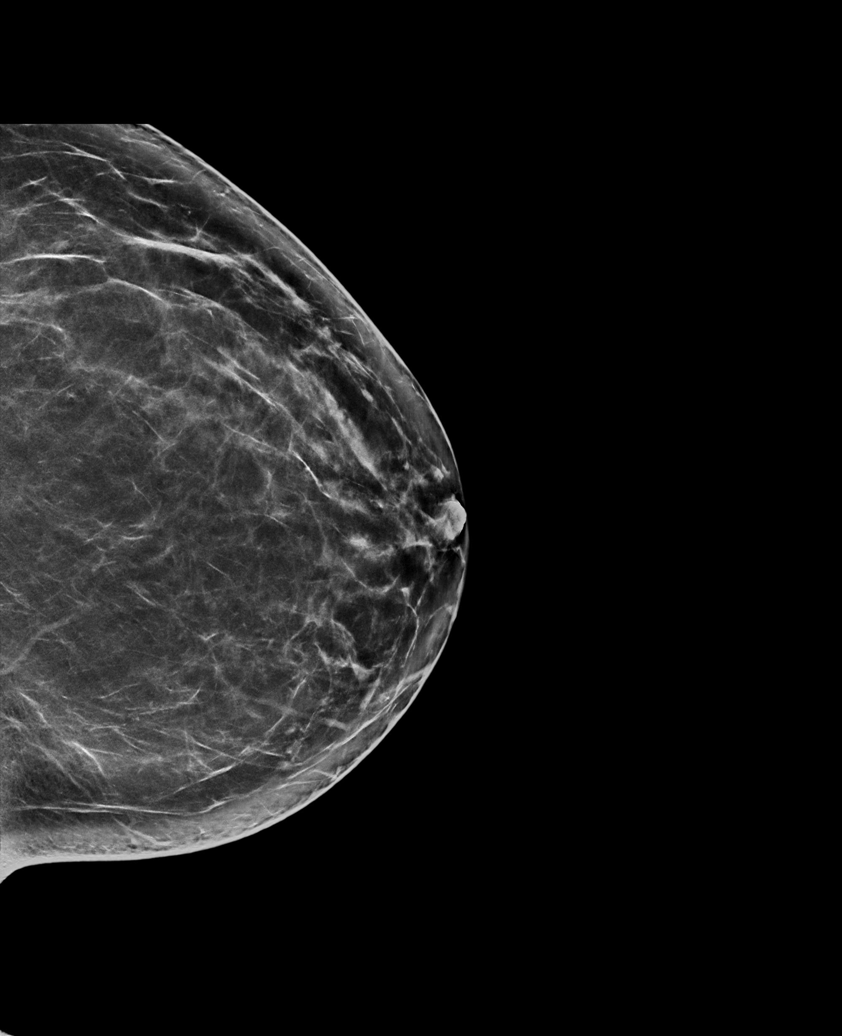

[L CC tomo · tomo slice 43/86.0]
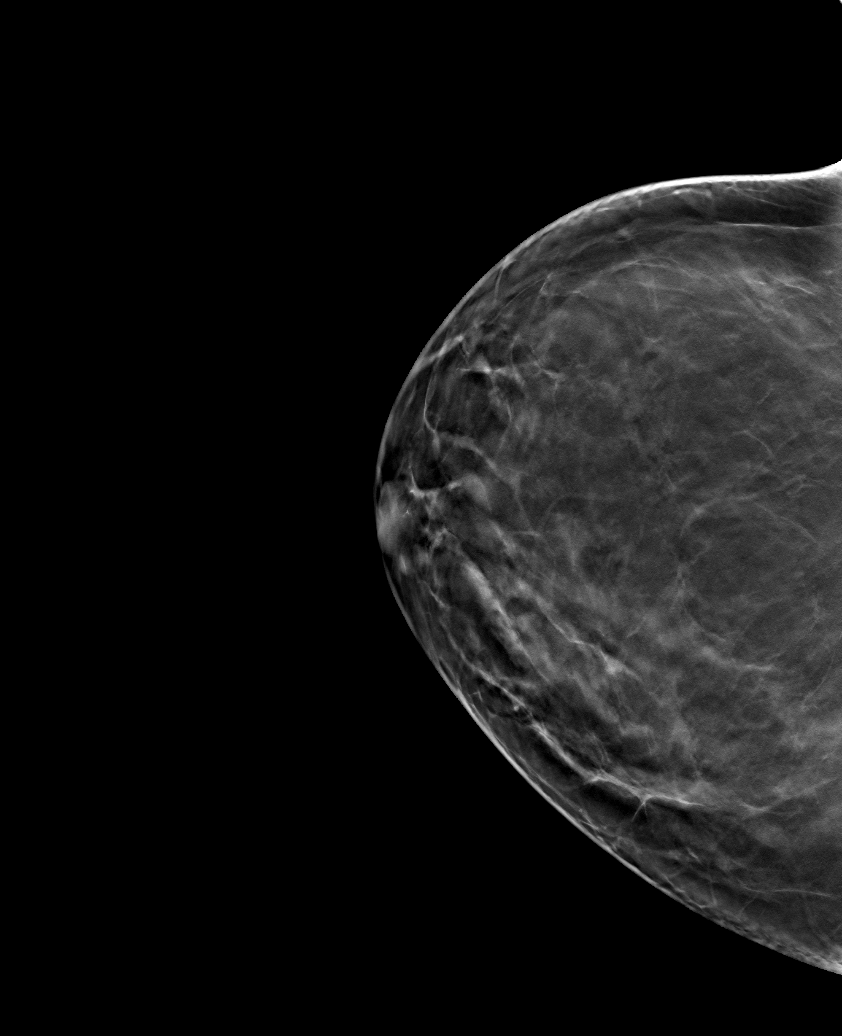

[6 of 30 positions shown; findings below may reference images not displayed]

ACR Breast Density Category b: There are scattered areas of
fibroglandular density.
FINDINGS: No suspicious mass, malignant type microcalcifications or distortion
detected in either breast.

Mammographic images were processed with CAD.

On physical exam, I do not palpate a mass in the upper-inner
quadrant of the right breast.

Targeted ultrasound is performed, showing normal tissue in the area
of clinical concern in the upper-inner quadrant of the right breast.
No solid or cystic mass, abnormal shadowing or distortion visualized
in the area of clinical concern.
IMPRESSION: No evidence of malignancy in either breast.

RECOMMENDATION:
If the clinical exam remains benign/stable screening mammography can
be deferred until the age of 40.

I have discussed the findings and recommendations with the patient.
Results were also provided in writing at the conclusion of the
visit. If applicable, a reminder letter will be sent to the patient
regarding the next appointment.

BI-RADS CATEGORY  1: Negative.

## 2022-06-06 ENCOUNTER — Other Ambulatory Visit: Payer: Self-pay

## 2022-06-06 ENCOUNTER — Emergency Department: Payer: 59

## 2022-06-06 ENCOUNTER — Emergency Department
Admission: EM | Admit: 2022-06-06 | Discharge: 2022-06-06 | Disposition: A | Discharge status: Home / Self Care | Payer: 59 | Attending: Student in an Organized Health Care Education/Training Program | Admitting: Student in an Organized Health Care Education/Training Program

## 2022-06-06 DIAGNOSIS — S8991XA Unspecified injury of right lower leg, initial encounter: Secondary | ICD-10-CM | POA: Diagnosis present

## 2022-06-06 DIAGNOSIS — X58XXXA Exposure to other specified factors, initial encounter: Secondary | ICD-10-CM | POA: Diagnosis not present

## 2022-06-06 DIAGNOSIS — S86811A Strain of other muscle(s) and tendon(s) at lower leg level, right leg, initial encounter: Secondary | ICD-10-CM | POA: Insufficient documentation

## 2022-06-06 LAB — CBC WITH DIFFERENTIAL/PLATELET
Abs Immature Granulocytes: 0.04 10*3/uL (ref 0.00–0.07)
Basophils Absolute: 0.1 10*3/uL (ref 0.0–0.1)
Basophils Relative: 1 %
Eosinophils Absolute: 0.1 10*3/uL (ref 0.0–0.5)
Eosinophils Relative: 1 %
HCT: 44.6 % (ref 36.0–46.0)
Hemoglobin: 15 g/dL (ref 12.0–15.0)
Immature Granulocytes: 1 %
Lymphocytes Relative: 31 %
Lymphs Abs: 2.7 10*3/uL (ref 0.7–4.0)
MCH: 29.2 pg (ref 26.0–34.0)
MCHC: 33.6 g/dL (ref 30.0–36.0)
MCV: 86.9 fL (ref 80.0–100.0)
Monocytes Absolute: 0.5 10*3/uL (ref 0.1–1.0)
Monocytes Relative: 6 %
Neutro Abs: 5.4 10*3/uL (ref 1.7–7.7)
Neutrophils Relative %: 60 %
Platelets: 282 10*3/uL (ref 150–400)
RBC: 5.13 MIL/uL — ABNORMAL HIGH (ref 3.87–5.11)
RDW: 11.9 % (ref 11.5–15.5)
WBC: 8.8 10*3/uL (ref 4.0–10.5)
nRBC: 0 % (ref 0.0–0.2)

## 2022-06-06 LAB — COMPREHENSIVE METABOLIC PANEL
ALT: 21 U/L (ref 0–44)
AST: 18 U/L (ref 15–41)
Albumin: 4.2 g/dL (ref 3.5–5.0)
Alkaline Phosphatase: 84 U/L (ref 38–126)
Anion gap: 8 (ref 5–15)
BUN: 10 mg/dL (ref 6–20)
CO2: 24 mmol/L (ref 22–32)
Calcium: 9.7 mg/dL (ref 8.9–10.3)
Chloride: 112 mmol/L — ABNORMAL HIGH (ref 98–111)
Creatinine, Ser: 0.71 mg/dL (ref 0.44–1.00)
GFR, Estimated: >60 mL/min (ref >60)
Glucose, Bld: 96 mg/dL (ref 70–99)
Potassium: 3.9 mmol/L (ref 3.5–5.1)
Sodium: 144 mmol/L (ref 135–145)
Total Bilirubin: 0.5 mg/dL (ref 0.3–1.2)
Total Protein: 7.6 g/dL (ref 6.5–8.1)

## 2022-06-06 MED ORDER — KETOROLAC TROMETHAMINE 30 MG/ML IJ SOLN
30.0000 mg | Freq: Once | INTRAMUSCULAR | Status: AC
  Administered 2022-06-06: 30 mg via INTRAMUSCULAR
  Filled 2022-06-06: qty 1

## 2022-06-06 MED ORDER — MELOXICAM 15 MG PO TABS: 15.0000 mg | ORAL_TABLET | Freq: Every day | ORAL | 0 refills | Status: AC

## 2022-06-06 NOTE — ED Provider Notes (Signed)
[facility] Provider Note  Patient Contact: 11:03 PM (approximate)   History   calf pain   HPI  Casey Hansen is a 31 y.o. female who presents the emergency department complaining of right calf pain.  Nontraumatic in nature.  Patient reports no significant erythema or edema.  She is concerned for blood clot though she has no history of DVT or PE in the past.  No bleeding or clotting disorders.  Patient has not tried any medications prior to arrival.     Physical Exam   Triage Vital Signs: ED Triage Vitals  Enc Vitals Group     BP 06/06/22 1954 118/80     Pulse Rate 06/06/22 1953 76     Resp 06/06/22 1953 16     Temp 06/06/22 1953 98.2 F (36.8 C)     Temp Source 06/06/22 1953 Oral     SpO2 06/06/22 1953 96 %     Weight 06/06/22 1953 250 lb (113.4 kg)     Height 06/06/22 1953 5\' 5"  (1.651 m)     Head Circumference --      Peak Flow --      Pain Score 06/06/22 1953 8     Pain Loc --      Pain Edu? --      Excl. in GC? --     Most recent vital signs: Vitals:   06/06/22 1953 06/06/22 1954  BP:  118/80  Pulse: 76   Resp: 16   Temp: 98.2 F (36.8 C)   SpO2: 96%      General: Alert and in no acute distress. Cardiovascular:  Good peripheral perfusion Respiratory: Normal respiratory effort without tachypnea or retractions. Lungs CTAB.  Musculoskeletal: Full range of motion to all extremities.  Visualization of the right calf reveals no gross erythema, edema.  No warmth to palpation.  Patient is tender midway through the gastrocnemius muscle extending to just proximal to the insertion site of the Achilles tendon.  No palpable abnormality or deficit.  There ispulse and sensation intact distally. Neurologic:  No gross focal neurologic deficits are appreciated.  Skin:   No rash noted Other:   ED Results / Procedures / Treatments   Labs (all labs ordered are listed, but only abnormal results are displayed) Labs Reviewed  CBC WITH  DIFFERENTIAL/PLATELET - Abnormal; Notable for the following components:      Result Value   RBC 5.13 (*)    All other components within normal limits  COMPREHENSIVE METABOLIC PANEL - Abnormal; Notable for the following components:   Chloride 112 (*)    All other components within normal limits     EKG     RADIOLOGY  I personally viewed, evaluated, and interpreted these images as part of my medical decision making, as well as reviewing the written report by the radiologist.  ED Provider Interpretation: No evidence of DVT on ultrasound  US Venous Img Lower Unilateral Right  Result Date: 06/06/2022 CLINICAL DATA:  Right calf pain. EXAM: Right LOWER EXTREMITY VENOUS DOPPLER ULTRASOUND TECHNIQUE: Gray-scale sonography with compression, as well as color and duplex ultrasound, were performed to evaluate the deep venous system(s) from the level of the common femoral vein through the popliteal and proximal calf veins. COMPARISON:  None Available. FINDINGS: VENOUS Normal compressibility of the common femoral, superficial femoral, and popliteal veins, as well as the visualized calf veins. Visualized portions of profunda femoral vein and great saphenous vein unremarkable. No filling defects to suggest DVT  on grayscale or color Doppler imaging. Doppler waveforms show normal direction of venous flow, normal respiratory plasticity and response to augmentation. Limited views of the contralateral common femoral vein are unremarkable. OTHER None. Limitations: none IMPRESSION: Negative venous Doppler examination for deep venous thrombosis in the right lower extremity. Electronically Signed   By: Rudie Meyer M.D.   On: 06/06/2022 21:39    PROCEDURES:  Critical Care performed: No  Procedures   MEDICATIONS ORDERED IN ED: Medications  ketorolac (TORADOL) 30 MG/ML injection 30 mg (has no administration in time range)     IMPRESSION / MDM / ASSESSMENT AND PLAN / ED COURSE  I reviewed the triage vital  signs and the nursing notes.                              Differential diagnosis includes, but is not limited to, DVT, gastrocnemius strain, Achilles tendon rupture  Patient's presentation is most consistent with acute illness / injury with system symptoms.   Patient's diagnosis is consistent with gastrocnemius strain.  Patient presents to the emergency department with right calf pain.  No reported trauma.  Patient has no gross erythema, edema.  No evidence of cellulitis.  Concern for DVT the patient had no history of DVT or PE.  Ultrasound is reassuring with no evidence of DVT.  Labs are reassuring.  Patient will be treated symptomatically with anti-inflammatory for likely gastrocnemius muscle strain.  Follow-up with orthopedics as needed.  Return precautions discussed with the patient.. Patient is given ED precautions to return to the ED for any worsening or new symptoms.        FINAL CLINICAL IMPRESSION(S) / ED DIAGNOSES   Final diagnoses:  Strain of calf muscle, right, initial encounter     Rx / DC Orders   ED Discharge Orders          Ordered    meloxicam (MOBIC) 15 MG tablet  Daily        06/06/22 2314             Note:  This document was prepared using Dragon voice recognition software and may include unintentional dictation errors.   Lanette Hampshire 06/06/22 2315    Willy Eddy, MD 06/07/22 [phone]

## 2022-06-06 NOTE — ED Triage Notes (Signed)
Pt states right calf pain that began today. No obvious swelling or discoloration noted. Pt states her doctor told her to come to Ed to r/o dvt.

## 2023-02-05 DIAGNOSIS — N939 Abnormal uterine and vaginal bleeding, unspecified: Secondary | ICD-10-CM | POA: Insufficient documentation

## 2023-02-10 DIAGNOSIS — M2240 Chondromalacia patellae, unspecified knee: Secondary | ICD-10-CM | POA: Insufficient documentation

## 2023-02-10 HISTORY — DX: Chondromalacia patellae, unspecified knee: M22.40

## 2023-03-17 ENCOUNTER — Encounter: Payer: Self-pay | Admitting: Nurse Practitioner

## 2023-03-17 ENCOUNTER — Ambulatory Visit: Payer: 59 | Admitting: Nurse Practitioner

## 2023-03-17 VITALS — BP 132/84 | HR 85 | Ht 65.0 in | Wt 249.4 lb

## 2023-03-17 DIAGNOSIS — L298 Other pruritus: Secondary | ICD-10-CM

## 2023-03-17 DIAGNOSIS — T7840XA Allergy, unspecified, initial encounter: Secondary | ICD-10-CM | POA: Diagnosis not present

## 2023-03-17 MED ORDER — PREDNISONE 50 MG PO TABS: ORAL_TABLET | ORAL | 0 refills | Status: DC

## 2023-03-17 MED ORDER — CETIRIZINE HCL 10 MG PO TABS: 10.0000 mg | ORAL_TABLET | Freq: Every day | ORAL | 2 refills | Status: DC

## 2023-03-17 MED ORDER — TRIAMCINOLONE ACETONIDE 0.5 % EX CREA: 1.0000 | TOPICAL_CREAM | Freq: Three times a day (TID) | CUTANEOUS | 0 refills | Status: DC

## 2023-03-17 NOTE — Progress Notes (Signed)
Established Patient Office Visit  Subjective:  Patient ID: Casey Hansen, female    DOB: Jan 09, 1991  Age: 32 y.o. MRN: [id number]  Chief Complaint  Patient presents with   Acute Visit    Rash on chest and neck    Acute visit, pruritic rash to neck and chest, upper back, most likely from heart monitor, specific pattern on left chest shows outline of heart monitor.    No other concerns at this time.   Past Medical History:  Diagnosis Date   GERD (gastroesophageal reflux disease)    Headache    Migraines/sinus   Hyperlipidemia    Hypertension     Past Surgical History:  Procedure Laterality Date   CHOLECYSTECTOMY N/A 04/22/2016   Procedure: LAPAROSCOPIC CHOLECYSTECTOMY WITH INTRAOPERATIVE CHOLANGIOGRAM;  Surgeon: Jules Husbands, MD;  Location: [facility];  Service: General;  Laterality: N/A;   ENDOSCOPIC RETROGRADE CHOLANGIOPANCREATOGRAPHY (ERCP) WITH PROPOFOL N/A 04/24/2016   Procedure: ENDOSCOPIC RETROGRADE CHOLANGIOPANCREATOGRAPHY (ERCP) WITH PROPOFOL;  Surgeon: Lucilla Lame, MD;  Location: [facility];  Service: Endoscopy;  Laterality: N/A;   ESOPHAGOGASTRODUODENOSCOPY (EGD) WITH PROPOFOL N/A 12/21/2015   Procedure: ESOPHAGOGASTRODUODENOSCOPY (EGD) WITH PROPOFOL;  Surgeon: Hulen Luster, MD;  Location: [place] [facility];  Service: Gastroenterology;  Laterality: N/A;   IMAGE GUIDED SINUS SURGERY N/A 12/26/2016   Procedure: IMAGE GUIDED SINUS SURGERY;  Surgeon: Beverly Gust, MD;  Location: [place];  Service: ENT;  Laterality: N/A;   MAXILLARY ANTROSTOMY Left 12/26/2016   Procedure: MAXILLARY ANTROSTOMY;  Surgeon: Beverly Gust, MD;  Location: [place];  Service: ENT;  Laterality: Left;   SEPTOPLASTY N/A 12/26/2016   Procedure: SEPTOPLASTY;  Surgeon: Beverly Gust, MD;  Location: [place];  Service: ENT;  Laterality: N/A;   TURBINATE REDUCTION Bilateral 12/26/2016   Procedure: Albin Felling REDUCTION;  Surgeon: Beverly Gust, MD;  Location: [place];  Service: ENT;  Laterality: Bilateral;    Social History   Socioeconomic History   Marital status: Married    Spouse name: Not on file   Number of children: Not on file   Years of education: Not on file   Highest education level: Not on file  Occupational History   Not on file  Tobacco Use   Smoking status: Never   Smokeless tobacco: Never  Substance and Sexual Activity   Alcohol use: No   Drug use: No   Sexual activity: Not on file  Other Topics Concern   Not on file  Social History Narrative   Not on file   Social Determinants of Health   Financial Resource Strain: Not on file  Food Insecurity: Not on file  Transportation Needs: Not on file  Physical Activity: Not on file  Stress: Not on file  Social Connections: Not on file  Intimate Partner Violence: Not on file    Family History  Problem Relation Age of Onset   Heart disease Mother    Diabetes Father    Hypertension Father    Cancer Paternal Grandmother        breast   Breast cancer Paternal Grandmother    Breast cancer Paternal Aunt     No Known Allergies  Review of Systems  Constitutional: Negative.   HENT: Negative.    Eyes: Negative.   Respiratory: Negative.    Cardiovascular: Negative.   Gastrointestinal: Negative.   Genitourinary: Negative.   Musculoskeletal: Negative.   Skin:  Positive for itching and rash.  Neurological: Negative.   Endo/Heme/Allergies: Negative.   Psychiatric/Behavioral: Negative.  Objective:   BP 132/84   Pulse 85   Ht 5\' 5"  (1.651 m)   Wt 249 lb 6.4 oz (113.1 kg)   SpO2 97%   BMI 41.50 kg/m   Vitals:   03/17/23 0924  BP: 132/84  Pulse: 85  Height: 5\' 5"  (1.651 m)  Weight: 249 lb 6.4 oz (113.1 kg)  SpO2: 97%  BMI (Calculated): 41.5    Physical Exam Vitals reviewed.  Constitutional:      Appearance: Normal appearance.  HENT:     Head: Normocephalic.     Nose: Nose normal.     Mouth/Throat:     Mouth: Mucous membranes are  moist.  Eyes:     Pupils: Pupils are equal, round, and reactive to light.  Cardiovascular:     Rate and Rhythm: Normal rate and regular rhythm.  Pulmonary:     Effort: Pulmonary effort is normal.     Breath sounds: Normal breath sounds.  Abdominal:     General: Bowel sounds are normal.     Palpations: Abdomen is soft.  Musculoskeletal:        General: Normal range of motion.     Cervical back: Normal range of motion and neck supple.  Skin:    Findings: Rash present.  Neurological:     Mental Status: She is alert and oriented to person, place, and time.  Psychiatric:        Mood and Affect: Mood normal.        Behavior: Behavior normal.      No results found for any visits on 03/17/23.  No results found for this or any previous visit (from the past 2160 hour(s)).    Assessment & Plan:   Problem List Items Addressed This Visit   None   No follow-ups on file.   Total time spent: 35 minutes  Evern Bio, NP  03/17/2023

## 2023-03-19 DIAGNOSIS — T7840XA Allergy, unspecified, initial encounter: Secondary | ICD-10-CM | POA: Insufficient documentation

## 2023-03-19 NOTE — Patient Instructions (Signed)
1) No tanning bed x 1 week 2) Cream and pred  3) Follow up prn

## 2023-03-19 NOTE — Progress Notes (Signed)
Established Patient Office Visit  Subjective:  Patient ID: Casey Hansen, female    DOB: 03-05-1991  Age: 32 y.o. MRN: [id number]  Chief Complaint  Patient presents with   Acute Visit    Rash on chest and neck    Patient has rash to upper chest and neck, possibly from heart monitor.  Erythematous rash present this AM when she awakened, less than 24 hours.    No other concerns at this time.   Past Medical History:  Diagnosis Date   GERD (gastroesophageal reflux disease)    Headache    Migraines/sinus   Hyperlipidemia    Hypertension     Past Surgical History:  Procedure Laterality Date   CHOLECYSTECTOMY N/A 04/22/2016   Procedure: LAPAROSCOPIC CHOLECYSTECTOMY WITH INTRAOPERATIVE CHOLANGIOGRAM;  Surgeon: Jules Husbands, MD;  Location: [facility];  Service: General;  Laterality: N/A;   ENDOSCOPIC RETROGRADE CHOLANGIOPANCREATOGRAPHY (ERCP) WITH PROPOFOL N/A 04/24/2016   Procedure: ENDOSCOPIC RETROGRADE CHOLANGIOPANCREATOGRAPHY (ERCP) WITH PROPOFOL;  Surgeon: Lucilla Lame, MD;  Location: [facility];  Service: Endoscopy;  Laterality: N/A;   ESOPHAGOGASTRODUODENOSCOPY (EGD) WITH PROPOFOL N/A 12/21/2015   Procedure: ESOPHAGOGASTRODUODENOSCOPY (EGD) WITH PROPOFOL;  Surgeon: Hulen Luster, MD;  Location: [facility];  Service: Gastroenterology;  Laterality: N/A;   IMAGE GUIDED SINUS SURGERY N/A 12/26/2016   Procedure: IMAGE GUIDED SINUS SURGERY;  Surgeon: Beverly Gust, MD;  Location: [place];  Service: ENT;  Laterality: N/A;   MAXILLARY ANTROSTOMY Left 12/26/2016   Procedure: MAXILLARY ANTROSTOMY;  Surgeon: Beverly Gust, MD;  Location: [place];  Service: ENT;  Laterality: Left;   SEPTOPLASTY N/A 12/26/2016   Procedure: SEPTOPLASTY;  Surgeon: Beverly Gust, MD;  Location: [place];  Service: ENT;  Laterality: N/A;   TURBINATE REDUCTION Bilateral 12/26/2016   Procedure: Albin Felling REDUCTION;  Surgeon: Beverly Gust, MD;  Location: [place];   Service: ENT;  Laterality: Bilateral;    Social History   Socioeconomic History   Marital status: Married    Spouse name: Not on file   Number of children: Not on file   Years of education: Not on file   Highest education level: Not on file  Occupational History   Not on file  Tobacco Use   Smoking status: Never   Smokeless tobacco: Never  Substance and Sexual Activity   Alcohol use: No   Drug use: No   Sexual activity: Not on file  Other Topics Concern   Not on file  Social History Narrative   Not on file   Social Determinants of Health   Financial Resource Strain: Not on file  Food Insecurity: Not on file  Transportation Needs: Not on file  Physical Activity: Not on file  Stress: Not on file  Social Connections: Not on file  Intimate Partner Violence: Not on file    Family History  Problem Relation Age of Onset   Heart disease Mother    Diabetes Father    Hypertension Father    Cancer Paternal Grandmother        breast   Breast cancer Paternal Grandmother    Breast cancer Paternal Aunt     No Known Allergies  Review of Systems  Constitutional: Negative.   HENT: Negative.    Eyes: Negative.   Respiratory: Negative.    Cardiovascular: Negative.   Gastrointestinal: Negative.   Genitourinary: Negative.   Musculoskeletal: Negative.   Skin:  Positive for itching and rash.  Neurological: Negative.   Endo/Heme/Allergies: Negative.   Psychiatric/Behavioral: Negative.  Objective:   BP 132/84   Pulse 85   Ht 5\' 5"  (1.651 m)   Wt 249 lb 6.4 oz (113.1 kg)   SpO2 97%   BMI 41.50 kg/m   Vitals:   03/17/23 0924  BP: 132/84  Pulse: 85  Height: 5\' 5"  (1.651 m)  Weight: 249 lb 6.4 oz (113.1 kg)  SpO2: 97%  BMI (Calculated): 41.5    Physical Exam Vitals reviewed.  Constitutional:      Appearance: Normal appearance.  HENT:     Head: Normocephalic.     Nose: Nose normal.  Eyes:     Pupils: Pupils are equal, round, and reactive to  light.  Cardiovascular:     Rate and Rhythm: Normal rate and regular rhythm.  Pulmonary:     Effort: Pulmonary effort is normal.     Breath sounds: Normal breath sounds.  Abdominal:     General: Bowel sounds are normal.     Palpations: Abdomen is soft.  Musculoskeletal:        General: Normal range of motion.     Cervical back: Normal range of motion and neck supple.  Skin:    Findings: Rash present.  Neurological:     Mental Status: She is alert and oriented to person, place, and time.  Psychiatric:        Mood and Affect: Mood normal.        Behavior: Behavior normal.      No results found for any visits on 03/17/23.  No results found for this or any previous visit (from the past 2160 hour(s)).    Assessment & Plan:   Problem List Items Addressed This Visit       Other   Allergic reaction - Primary    Return if symptoms worsen or fail to improve.   Total time spent: 35 minutes  Evern Bio, NP  03/17/2023

## 2023-03-26 ENCOUNTER — Other Ambulatory Visit: Payer: 59

## 2023-03-26 DIAGNOSIS — E669 Obesity, unspecified: Secondary | ICD-10-CM

## 2023-03-26 DIAGNOSIS — R7301 Impaired fasting glucose: Secondary | ICD-10-CM

## 2023-03-26 DIAGNOSIS — I10 Essential (primary) hypertension: Secondary | ICD-10-CM

## 2023-03-26 DIAGNOSIS — E785 Hyperlipidemia, unspecified: Secondary | ICD-10-CM

## 2023-03-27 ENCOUNTER — Encounter: Payer: Self-pay | Admitting: Nurse Practitioner

## 2023-03-27 LAB — LIPID PANEL
Chol/HDL Ratio: 3.3 ratio (ref 0.0–4.4)
Cholesterol, Total: 143 mg/dL (ref 100–199)
HDL: 43 mg/dL (ref >39)
LDL Chol Calc (NIH): 78 mg/dL (ref 0–99)
Triglycerides: 124 mg/dL (ref 0–149)
VLDL Cholesterol Cal: 22 mg/dL (ref 5–40)

## 2023-03-27 LAB — CBC WITH DIFFERENTIAL
Basophils Absolute: 0 10*3/uL (ref 0.0–0.2)
Basos: 1 %
EOS (ABSOLUTE): 0.1 10*3/uL (ref 0.0–0.4)
Eos: 1 %
Hematocrit: 46.1 % (ref 34.0–46.6)
Hemoglobin: 15 g/dL (ref 11.1–15.9)
Immature Grans (Abs): 0 10*3/uL (ref 0.0–0.1)
Immature Granulocytes: 0 %
Lymphocytes Absolute: 2 10*3/uL (ref 0.7–3.1)
Lymphs: 24 %
MCH: 29.4 pg (ref 26.6–33.0)
MCHC: 32.5 g/dL (ref 31.5–35.7)
MCV: 90 fL (ref 79–97)
Monocytes Absolute: 0.5 10*3/uL (ref 0.1–0.9)
Monocytes: 6 %
Neutrophils Absolute: 6 10*3/uL (ref 1.4–7.0)
Neutrophils: 68 %
RBC: 5.1 x10E6/uL (ref 3.77–5.28)
RDW: 12.4 % (ref 11.7–15.4)
WBC: 8.7 10*3/uL (ref 3.4–10.8)

## 2023-03-27 LAB — CMP14+EGFR
ALT: 26 IU/L (ref 0–32)
AST: 18 IU/L (ref 0–40)
Albumin/Globulin Ratio: 1.7 (ref 1.2–2.2)
Albumin: 4.4 g/dL (ref 3.9–4.9)
Alkaline Phosphatase: 108 IU/L (ref 44–121)
BUN/Creatinine Ratio: 10 (ref 9–23)
BUN: 9 mg/dL (ref 6–20)
Bilirubin Total: 0.5 mg/dL (ref 0.0–1.2)
CO2: 22 mmol/L (ref 20–29)
Calcium: 9.7 mg/dL (ref 8.7–10.2)
Chloride: 102 mmol/L (ref 96–106)
Creatinine, Ser: 0.87 mg/dL (ref 0.57–1.00)
Globulin, Total: 2.6 g/dL (ref 1.5–4.5)
Glucose: 80 mg/dL (ref 70–99)
Potassium: 4.7 mmol/L (ref 3.5–5.2)
Sodium: 139 mmol/L (ref 134–144)
Total Protein: 7 g/dL (ref 6.0–8.5)
eGFR: 90 mL/min/{1.73_m2} (ref >59)

## 2023-03-27 LAB — HEMOGLOBIN A1C
Est. average glucose Bld gHb Est-mCnc: 111 mg/dL
Hgb A1c MFr Bld: 5.5 % (ref 4.8–5.6)

## 2023-03-27 LAB — TSH: TSH: 0.718 u[IU]/mL (ref 0.450–4.500)

## 2023-03-31 ENCOUNTER — Ambulatory Visit: Payer: 59 | Admitting: Nurse Practitioner

## 2023-04-07 ENCOUNTER — Encounter: Payer: Self-pay | Admitting: Nurse Practitioner

## 2023-04-07 ENCOUNTER — Ambulatory Visit: Payer: 59 | Admitting: Nurse Practitioner

## 2023-04-07 VITALS — BP 124/76 | HR 80 | Ht 65.0 in | Wt 250.0 lb

## 2023-04-07 DIAGNOSIS — F419 Anxiety disorder, unspecified: Secondary | ICD-10-CM

## 2023-04-07 DIAGNOSIS — E782 Mixed hyperlipidemia: Secondary | ICD-10-CM | POA: Diagnosis not present

## 2023-04-07 DIAGNOSIS — H9202 Otalgia, left ear: Secondary | ICD-10-CM

## 2023-04-07 DIAGNOSIS — J301 Allergic rhinitis due to pollen: Secondary | ICD-10-CM | POA: Diagnosis not present

## 2023-04-07 DIAGNOSIS — K219 Gastro-esophageal reflux disease without esophagitis: Secondary | ICD-10-CM

## 2023-04-07 MED ORDER — FLUOXETINE HCL 60 MG PO TABS: 60.0000 mg | ORAL_TABLET | Freq: Every day | ORAL | 1 refills | Status: DC

## 2023-04-07 MED ORDER — CIPROFLOXACIN HCL 0.2 % OT SOLN
0.2000 mL | Freq: Two times a day (BID) | OTIC | 0 refills | Status: DC
Start: 2023-04-07 — End: 2023-07-16

## 2023-04-07 NOTE — Patient Instructions (Addendum)
1) Increase prozac from 40 to 60 mg daily 2) Cipro Otic for left ear 3) Follow up appt in 6 months, fasting labs prior

## 2023-04-07 NOTE — Progress Notes (Signed)
Established Patient Office Visit  Subjective:  Patient ID: Casey Hansen, female    DOB: 15-Nov-1991  Age: 32 y.o. MRN: [id number]  No chief complaint on file.   Follow up, lab results reviewed.  Good standing.  No abnormalities.  Patient's rash has dissipated.  Pt would like to increase venlafaxine.    No other concerns at this time.   Past Medical History:  Diagnosis Date   GERD (gastroesophageal reflux disease)    Headache    Migraines/sinus   Hyperlipidemia    Hypertension     Past Surgical History:  Procedure Laterality Date   CHOLECYSTECTOMY N/A 04/22/2016   Procedure: LAPAROSCOPIC CHOLECYSTECTOMY WITH INTRAOPERATIVE CHOLANGIOGRAM;  Surgeon: Leafy Ro, MD;  Location: [facility];  Service: General;  Laterality: N/A;   ENDOSCOPIC RETROGRADE CHOLANGIOPANCREATOGRAPHY (ERCP) WITH PROPOFOL N/A 04/24/2016   Procedure: ENDOSCOPIC RETROGRADE CHOLANGIOPANCREATOGRAPHY (ERCP) WITH PROPOFOL;  Surgeon: Midge Minium, MD;  Location: [facility];  Service: Endoscopy;  Laterality: N/A;   ESOPHAGOGASTRODUODENOSCOPY (EGD) WITH PROPOFOL N/A 12/21/2015   Procedure: ESOPHAGOGASTRODUODENOSCOPY (EGD) WITH PROPOFOL;  Surgeon: Wallace Cullens, MD;  Location: [facility];  Service: Gastroenterology;  Laterality: N/A;   IMAGE GUIDED SINUS SURGERY N/A 12/26/2016   Procedure: IMAGE GUIDED SINUS SURGERY;  Surgeon: Linus Salmons, MD;  Location: [facility];  Service: ENT;  Laterality: N/A;   MAXILLARY ANTROSTOMY Left 12/26/2016   Procedure: MAXILLARY ANTROSTOMY;  Surgeon: Linus Salmons, MD;  Location: [facility];  Service: ENT;  Laterality: Left;   SEPTOPLASTY N/A 12/26/2016   Procedure: SEPTOPLASTY;  Surgeon: Linus Salmons, MD;  Location: [facility];  Service: ENT;  Laterality: N/A;   TURBINATE REDUCTION Bilateral 12/26/2016   Procedure: Frederik Schmidt REDUCTION;  Surgeon: Linus Salmons, MD;  Location: [facility];  Service: ENT;  Laterality: Bilateral;    Social History    Socioeconomic History   Marital status: Married    Spouse name: Not on file   Number of children: Not on file   Years of education: Not on file   Highest education level: Not on file  Occupational History   Not on file  Tobacco Use   Smoking status: Never   Smokeless tobacco: Never  Substance and Sexual Activity   Alcohol use: No   Drug use: No   Sexual activity: Not on file  Other Topics Concern   Not on file  Social History Narrative   Not on file   Social Determinants of Health   Financial Resource Strain: Not on file  Food Insecurity: Not on file  Transportation Needs: Not on file  Physical Activity: Not on file  Stress: Not on file  Social Connections: Not on file  Intimate Partner Violence: Not on file    Family History  Problem Relation Age of Onset   Heart disease Mother    Diabetes Father    Hypertension Father    Cancer Paternal Grandmother        breast   Breast cancer Paternal Grandmother    Breast cancer Paternal Aunt     No Known Allergies  Review of Systems  Constitutional: Negative.   HENT: Negative.    Eyes: Negative.   Respiratory: Negative.    Cardiovascular: Negative.   Gastrointestinal: Negative.   Genitourinary: Negative.   Musculoskeletal: Negative.   Skin: Negative.   Neurological: Negative.   Endo/Heme/Allergies: Negative.   Psychiatric/Behavioral: Negative.         Objective:   There were no vitals taken for this visit.  There were no vitals filed for this visit.  Physical Exam Vitals reviewed.  Constitutional:      Appearance: Normal appearance.  HENT:     Head: Normocephalic.     Nose: Nose normal.     Mouth/Throat:     Mouth: Mucous membranes are moist.  Eyes:     Pupils: Pupils are equal, round, and reactive to light.  Cardiovascular:     Rate and Rhythm: Normal rate and regular rhythm.  Pulmonary:     Effort: Pulmonary effort is normal.     Breath sounds: Normal breath sounds.  Abdominal:      General: Bowel sounds are normal.     Palpations: Abdomen is soft.  Musculoskeletal:        General: Normal range of motion.     Cervical back: Normal range of motion and neck supple.  Skin:    General: Skin is warm and dry.  Neurological:     Mental Status: She is alert and oriented to person, place, and time.  Psychiatric:        Mood and Affect: Mood normal.        Behavior: Behavior normal.      No results found for any visits on 04/07/23.  Recent Results (from the past 2160 hour(s))  Hemoglobin A1c     Status: None   Collection Time: 03/26/23  8:43 AM  Result Value Ref Range   Hgb A1c MFr Bld 5.5 4.8 - 5.6 %    Comment:          Prediabetes: 5.7 - 6.4          Diabetes: >6.4          Glycemic control for adults with diabetes: <7.0    Est. average glucose Bld gHb Est-mCnc 111 mg/dL  TSH     Status: None   Collection Time: 03/26/23  8:43 AM  Result Value Ref Range   TSH 0.718 0.450 - 4.500 uIU/mL  CMP14+EGFR     Status: None   Collection Time: 03/26/23  8:43 AM  Result Value Ref Range   Glucose 80 70 - 99 mg/dL   BUN 9 6 - 20 mg/dL   Creatinine, Ser 1.61 0.57 - 1.00 mg/dL   eGFR 90 >09 UE/AVW/0.98   BUN/Creatinine Ratio 10 9 - 23   Sodium 139 134 - 144 mmol/L   Potassium 4.7 3.5 - 5.2 mmol/L   Chloride 102 96 - 106 mmol/L   CO2 22 20 - 29 mmol/L   Calcium 9.7 8.7 - 10.2 mg/dL   Total Protein 7.0 6.0 - 8.5 g/dL   Albumin 4.4 3.9 - 4.9 g/dL   Globulin, Total 2.6 1.5 - 4.5 g/dL   Albumin/Globulin Ratio 1.7 1.2 - 2.2   Bilirubin Total 0.5 0.0 - 1.2 mg/dL   Alkaline Phosphatase 108 44 - 121 IU/L   AST 18 0 - 40 IU/L   ALT 26 0 - 32 IU/L  Lipid panel     Status: None   Collection Time: 03/26/23  8:43 AM  Result Value Ref Range   Cholesterol, Total 143 100 - 199 mg/dL   Triglycerides 119 0 - 149 mg/dL   HDL 43 >14 mg/dL   VLDL Cholesterol Cal 22 5 - 40 mg/dL   LDL Chol Calc (NIH) 78 0 - 99 mg/dL   Chol/HDL Ratio 3.3 0.0 - 4.4 ratio    Comment:  T. Chol/HDL Ratio                                             Men  Women                               1/2 Avg.Risk  3.4    3.3                                   Avg.Risk  5.0    4.4                                2X Avg.Risk  9.6    7.1                                3X Avg.Risk 23.4   11.0   CBC With Differential     Status: None   Collection Time: 03/26/23  8:43 AM  Result Value Ref Range   WBC 8.7 3.4 - 10.8 x10E3/uL   RBC 5.10 3.77 - 5.28 x10E6/uL   Hemoglobin 15.0 11.1 - 15.9 g/dL   Hematocrit 25.9 56.3 - 46.6 %   MCV 90 79 - 97 fL   MCH 29.4 26.6 - 33.0 pg   MCHC 32.5 31.5 - 35.7 g/dL   RDW 87.5 64.3 - 32.9 %   Neutrophils 68 Not Estab. %   Lymphs 24 Not Estab. %   Monocytes 6 Not Estab. %   Eos 1 Not Estab. %   Basos 1 Not Estab. %   Neutrophils Absolute 6.0 1.4 - 7.0 x10E3/uL   Lymphocytes Absolute 2.0 0.7 - 3.1 x10E3/uL   Monocytes Absolute 0.5 0.1 - 0.9 x10E3/uL   EOS (ABSOLUTE) 0.1 0.0 - 0.4 x10E3/uL   Basophils Absolute 0.0 0.0 - 0.2 x10E3/uL   Immature Granulocytes 0 Not Estab. %   Immature Grans (Abs) 0.0 0.0 - 0.1 x10E3/uL      Assessment & Plan:   Problem List Items Addressed This Visit       Respiratory   Allergic rhinitis due to pollen - Primary     Digestive   Gastroesophageal reflux disease     Other   Mixed hyperlipidemia   Anxiety    No follow-ups on file.   Total time spent: 35 minutes  Orson Eva, NP  04/07/2023

## 2023-05-02 ENCOUNTER — Other Ambulatory Visit: Payer: Self-pay | Admitting: Internal Medicine

## 2023-05-02 MED ORDER — FLUCONAZOLE 150 MG PO TABS: 150.0000 mg | ORAL_TABLET | Freq: Every day | ORAL | 0 refills | Status: AC

## 2023-05-07 ENCOUNTER — Other Ambulatory Visit: Payer: Self-pay | Admitting: Nurse Practitioner

## 2023-05-11 ENCOUNTER — Emergency Department
Admission: EM | Admit: 2023-05-11 | Discharge: 2023-05-11 | Disposition: A | Discharge status: Home / Self Care | Payer: 59 | Attending: Emergency Medicine | Admitting: Emergency Medicine

## 2023-05-11 ENCOUNTER — Emergency Department: Payer: 59

## 2023-05-11 ENCOUNTER — Encounter: Payer: Self-pay | Admitting: Emergency Medicine

## 2023-05-11 DIAGNOSIS — E871 Hypo-osmolality and hyponatremia: Secondary | ICD-10-CM | POA: Insufficient documentation

## 2023-05-11 DIAGNOSIS — R102 Pelvic and perineal pain: Secondary | ICD-10-CM | POA: Diagnosis not present

## 2023-05-11 DIAGNOSIS — D72829 Elevated white blood cell count, unspecified: Secondary | ICD-10-CM | POA: Insufficient documentation

## 2023-05-11 DIAGNOSIS — G8918 Other acute postprocedural pain: Secondary | ICD-10-CM | POA: Diagnosis not present

## 2023-05-11 DIAGNOSIS — R1031 Right lower quadrant pain: Secondary | ICD-10-CM | POA: Diagnosis present

## 2023-05-11 LAB — URINALYSIS, ROUTINE W REFLEX MICROSCOPIC
Bacteria, UA: NONE SEEN
Bilirubin Urine: NEGATIVE
Glucose, UA: NEGATIVE mg/dL
Hgb urine dipstick: NEGATIVE
Ketones, ur: NEGATIVE mg/dL
Nitrite: NEGATIVE
Protein, ur: NEGATIVE mg/dL
Specific Gravity, Urine: 1.01 (ref 1.005–1.030)
pH: 7 (ref 5.0–8.0)

## 2023-05-11 LAB — WET PREP, GENITAL
Clue Cells Wet Prep HPF POC: NONE SEEN
Sperm: NONE SEEN
Trich, Wet Prep: NONE SEEN
WBC, Wet Prep HPF POC: <10 (ref <10)
Yeast Wet Prep HPF POC: NONE SEEN

## 2023-05-11 LAB — COMPREHENSIVE METABOLIC PANEL
ALT: 30 U/L (ref 0–44)
AST: 23 U/L (ref 15–41)
Albumin: 4 g/dL (ref 3.5–5.0)
Alkaline Phosphatase: 95 U/L (ref 38–126)
Anion gap: 10 (ref 5–15)
BUN: 15 mg/dL (ref 6–20)
CO2: 26 mmol/L (ref 22–32)
Calcium: 9 mg/dL (ref 8.9–10.3)
Chloride: 98 mmol/L (ref 98–111)
Creatinine, Ser: 0.8 mg/dL (ref 0.44–1.00)
GFR, Estimated: >60 mL/min (ref >60)
Glucose, Bld: 95 mg/dL (ref 70–99)
Potassium: 4 mmol/L (ref 3.5–5.1)
Sodium: 134 mmol/L — ABNORMAL LOW (ref 135–145)
Total Bilirubin: 0.6 mg/dL (ref 0.3–1.2)
Total Protein: 7.1 g/dL (ref 6.5–8.1)

## 2023-05-11 LAB — CBC WITH DIFFERENTIAL/PLATELET
Abs Immature Granulocytes: 0.08 10*3/uL — ABNORMAL HIGH (ref 0.00–0.07)
Basophils Absolute: 0.1 10*3/uL (ref 0.0–0.1)
Basophils Relative: 1 %
Eosinophils Absolute: 0.1 10*3/uL (ref 0.0–0.5)
Eosinophils Relative: 1 %
HCT: 44.9 % (ref 36.0–46.0)
Hemoglobin: 14.8 g/dL (ref 12.0–15.0)
Immature Granulocytes: 1 %
Lymphocytes Relative: 30 %
Lymphs Abs: 4.5 10*3/uL — ABNORMAL HIGH (ref 0.7–4.0)
MCH: 29.7 pg (ref 26.0–34.0)
MCHC: 33 g/dL (ref 30.0–36.0)
MCV: 90.2 fL (ref 80.0–100.0)
Monocytes Absolute: 0.8 10*3/uL (ref 0.1–1.0)
Monocytes Relative: 6 %
Neutro Abs: 9.5 10*3/uL — ABNORMAL HIGH (ref 1.7–7.7)
Neutrophils Relative %: 61 %
Platelets: 330 10*3/uL (ref 150–400)
RBC: 4.98 MIL/uL (ref 3.87–5.11)
RDW: 12.4 % (ref 11.5–15.5)
WBC: 15.1 10*3/uL — ABNORMAL HIGH (ref 4.0–10.5)
nRBC: 0 % (ref 0.0–0.2)

## 2023-05-11 LAB — LIPASE, BLOOD: Lipase: 38 U/L (ref 11–51)

## 2023-05-11 LAB — POC URINE PREG, ED: Preg Test, Ur: NEGATIVE

## 2023-05-11 MED ORDER — IOHEXOL 300 MG/ML  SOLN
100.0000 mL | Freq: Once | INTRAMUSCULAR | Status: AC | PRN
  Administered 2023-05-11: 100 mL via INTRAVENOUS

## 2023-05-11 MED ORDER — HYDROMORPHONE HCL 1 MG/ML IJ SOLN
0.5000 mg | Freq: Once | INTRAMUSCULAR | Status: AC
  Administered 2023-05-11: 0.5 mg via INTRAVENOUS
  Filled 2023-05-11: qty 0.5

## 2023-05-11 MED ORDER — ONDANSETRON HCL 4 MG/2ML IJ SOLN
4.0000 mg | Freq: Once | INTRAMUSCULAR | Status: AC
  Administered 2023-05-11: 4 mg via INTRAVENOUS
  Filled 2023-05-11: qty 2

## 2023-05-11 MED ORDER — KETOROLAC TROMETHAMINE 15 MG/ML IJ SOLN
15.0000 mg | Freq: Once | INTRAMUSCULAR | Status: AC
  Administered 2023-05-11: 15 mg via INTRAVENOUS
  Filled 2023-05-11: qty 1

## 2023-05-11 NOTE — ED Provider Notes (Signed)
[facility] Provider Note    Event Date/Time   First MD Initiated Contact with Patient 05/11/23 2000     (approximate)   History   Abdominal Pain   HPI  Casey Hansen is a 32 y.o. female who is status post a hysterectomy on 5/10 who comes in with worsening right lower quadrant pain and pelvic pain.  Patient was follow-up in office on 5/21 diagnosed with vaginal cuff cellulitis placed on Augmentin.  Also noted to have a rash secondary to Dermabond so was placed on prednisone.  She reports finishing the prednisone still having the Augmentin left.  She reports worsening pain in her right lower abdomen.  She denies any new sexual partner she reports only being with her husband.  She denies any worsening discharge.  She denies any other associated nausea or vomiting.  She reports only taking Tylenol at home for pain and.   Physical Exam   Triage Vital Signs: ED Triage Vitals  Enc Vitals Group     BP 05/11/23 1935 126/74     Pulse Rate 05/11/23 1935 82     Resp 05/11/23 1935 18     Temp 05/11/23 1935 98.3 F (36.8 C)     Temp Source 05/11/23 1935 Oral     SpO2 05/11/23 1935 100 %     Weight 05/11/23 1934 248 lb (112.5 kg)     Height 05/11/23 1934 5\' 5"  (1.651 m)     Head Circumference --      Peak Flow --      Pain Score 05/11/23 1934 6     Pain Loc --      Pain Edu? --      Excl. in GC? --     Most recent vital signs: Vitals:   05/11/23 1935  BP: 126/74  Pulse: 82  Resp: 18  Temp: 98.3 F (36.8 C)  SpO2: 100%     General: Awake, no distress.  CV:  Good peripheral perfusion.  Resp:  Normal effort.  Abd:  No distention.  Tender in the right lower quadrant. Well healing surgical scars.  Other:     ED Results / Procedures / Treatments   Labs (all labs ordered are listed, but only abnormal results are displayed) Labs Reviewed  COMPREHENSIVE METABOLIC PANEL - Abnormal; Notable for the following components:      Result Value   Sodium  134 (*)    All other components within normal limits  CBC WITH DIFFERENTIAL/PLATELET - Abnormal; Notable for the following components:   WBC 15.1 (*)    Neutro Abs 9.5 (*)    Lymphs Abs 4.5 (*)    Abs Immature Granulocytes 0.08 (*)    All other components within normal limits  URINALYSIS, ROUTINE W REFLEX MICROSCOPIC - Abnormal; Notable for the following components:   Color, Urine STRAW (*)    APPearance CLEAR (*)    Leukocytes,Ua MODERATE (*)    All other components within normal limits  WET PREP, GENITAL  LIPASE, BLOOD  POC URINE PREG, ED     RADIOLOGY I have reviewed the CT personally interpreted no evidence of appendicitis  PROCEDURES:  Critical Care performed: No  Procedures   MEDICATIONS ORDERED IN ED: Medications  HYDROmorphone (DILAUDID) injection 0.5 mg (0.5 mg Intravenous Given 05/11/23 2041)  ondansetron (ZOFRAN) injection 4 mg (4 mg Intravenous Given 05/11/23 2041)  iohexol (OMNIPAQUE) 300 MG/ML solution 100 mL (100 mLs Intravenous Contrast Given 05/11/23 2101)  IMPRESSION / MDM / ASSESSMENT AND PLAN / ED COURSE  I reviewed the triage vital signs and the nursing notes.   Patient's presentation is most consistent with acute presentation with potential threat to life or bodily function.   Patient comes in with some postop pain.  Her white count is elevated but this could be related to the recent steroids therefore we will proceed with CT imaging to rule out appendicitis as well as ultrasound to make sure there is no signs for any significant ovarian abscess or complication with her ovaries.  She denies STD testing but will test with a wet prep to make sure no BV or yeast that could be contributing to symptoms.  Pregnancy test was negative.  CMP shows slightly low sodium.  Lipase was normal CBC shows elevated white count patient currently on Augmentin.  Urine without obvious evidence of UTI.  CT imaging was negative  Patient's ultrasound was reassuring does  have a follicular cyst on the left ovary with normal blood flow.  Right ovary also has normal blood flow.   I discussed with patient that she has a reassuring CT scan and ultrasound.  Her white count elevation could just be related to the recent steroids that she was on.  We discussed however that there can be some things that the OB/GYN team may know more about and that they still need to follow-up with her surgeon given her continued discomfort report as for acute pathology this was all reassuring she still on a course of Augmentin.  Discussed treatment with Tylenol, ibuprofen and following up with OB/GYN.  Offered to do pelvic exam patient declined she denies any concerns for STDs and denies any vaginal discharge      FINAL CLINICAL IMPRESSION(S) / ED DIAGNOSES   Final diagnoses:  Post-op pain     Rx / DC Orders   ED Discharge Orders     None        Note:  This document was prepared using Dragon voice recognition software and may include unintentional dictation errors.   Concha Se, MD 05/11/23 2322

## 2023-05-11 NOTE — ED Triage Notes (Signed)
Pt presents ambulatory to triage via POV with complaints of RLQ pain that started over the weekend. Pt had a hysterectomy on 5/10 - pain has been consistent since the procedure. Pt was dx with a UTI  last week - compliant with meds but is now having RLQ pain with associated pelvic pain. A&Ox4 at this time. Denies fevers, chills, N/V/D, CP or SOB.

## 2023-05-11 NOTE — Discharge Instructions (Addendum)
Your workup was reassuring you should call your OB team however to make a follow-up appointment given your continued pain.  You can take Tylenol 1 g every 8 hours and ibuprofen 600 every 6-8 hours with food to help with pain.  Return to the ER if you develop fevers, worsening pain or any other concerns   IMPRESSION: 1. Normal sonographic appearance of the ovaries. Follicular cyst in the left ovary measuring 2.4 cm is considered physiologic, and no follow-up is recommended. Normal ovarian blood flow. 2. Hysterectomy.  No pelvic free fluid or fluid collection.  IMPRESSION: No acute findings in the abdomen or pelvis.   Normal appendix.

## 2023-05-20 ENCOUNTER — Encounter: Payer: Self-pay | Admitting: Nurse Practitioner

## 2023-05-20 ENCOUNTER — Ambulatory Visit (INDEPENDENT_AMBULATORY_CARE_PROVIDER_SITE_OTHER): Payer: 59 | Admitting: Family

## 2023-05-20 DIAGNOSIS — R300 Dysuria: Secondary | ICD-10-CM | POA: Diagnosis not present

## 2023-05-20 DIAGNOSIS — N390 Urinary tract infection, site not specified: Secondary | ICD-10-CM

## 2023-05-20 DIAGNOSIS — R319 Hematuria, unspecified: Secondary | ICD-10-CM | POA: Diagnosis not present

## 2023-05-20 LAB — POCT URINALYSIS DIPSTICK
Bilirubin, UA: NEGATIVE
Blood, UA: POSITIVE
Glucose, UA: NEGATIVE
Ketones, UA: NEGATIVE
Nitrite, UA: NEGATIVE
Protein, UA: NEGATIVE
Spec Grav, UA: 1.015 (ref 1.010–1.025)
Urobilinogen, UA: 0.2 E.U./dL
pH, UA: 6 (ref 5.0–8.0)

## 2023-05-20 MED ORDER — CIPROFLOXACIN HCL 500 MG PO TABS: 500.0000 mg | ORAL_TABLET | Freq: Two times a day (BID) | ORAL | 0 refills | Status: AC

## 2023-05-20 NOTE — Progress Notes (Signed)
   CHIEF COMPLAINT  UA for possible UTI.      REASON FOR VISIT  Suspected UTI - UA in office.       ASSESSMENT & PLAN  Problem List Items Addressed This Visit   None Visit Diagnoses     Dysuria    -  Primary   Relevant Orders   POCT Urinalysis Dipstick (16109) (Completed)   Urinary tract infection with hematuria, site unspecified       Sending antibiotics.  UA in office abnormal.   Pt. will notify if not improving. Has f/u next week with OB/GYN from surgery.        Total time spent: 10 minutes  KEYETTA DEVANE, FNP  05/20/2023

## 2023-05-20 NOTE — Addendum Note (Signed)
Addended by: Grayling Congress on: 05/20/2023 08:49 PM   Modules accepted: Level of Service

## 2023-06-10 ENCOUNTER — Encounter: Payer: Self-pay | Admitting: Nurse Practitioner

## 2023-06-18 ENCOUNTER — Other Ambulatory Visit: Payer: Self-pay | Admitting: Nurse Practitioner

## 2023-06-22 ENCOUNTER — Telehealth: Payer: Self-pay | Admitting: Nurse Practitioner

## 2023-06-22 NOTE — Telephone Encounter (Signed)
Patient is having drainage, sneezing, and coughing up green mucus. States that she can "taste the infection". She believes she has a sinus infection. Can you send something to [facility] please?

## 2023-06-23 MED ORDER — AMOXICILLIN-POT CLAVULANATE 875-125 MG PO TABS: 1.0000 | ORAL_TABLET | Freq: Two times a day (BID) | ORAL | 0 refills | Status: DC

## 2023-06-23 MED ORDER — FLUCONAZOLE 150 MG PO TABS: 150.0000 mg | ORAL_TABLET | Freq: Every day | ORAL | 0 refills | Status: DC

## 2023-07-16 ENCOUNTER — Ambulatory Visit: Payer: Managed Care, Other (non HMO) | Admitting: Family

## 2023-07-16 ENCOUNTER — Encounter: Payer: Self-pay | Admitting: Family

## 2023-07-16 VITALS — BP 122/78 | HR 90 | Ht 66.0 in | Wt 262.4 lb

## 2023-07-16 DIAGNOSIS — N760 Acute vaginitis: Secondary | ICD-10-CM | POA: Diagnosis not present

## 2023-07-16 DIAGNOSIS — M545 Low back pain, unspecified: Secondary | ICD-10-CM

## 2023-07-16 DIAGNOSIS — M542 Cervicalgia: Secondary | ICD-10-CM

## 2023-07-16 LAB — POCT URINALYSIS DIPSTICK
Bilirubin, UA: NEGATIVE
Blood, UA: POSITIVE
Glucose, UA: NEGATIVE
Ketones, UA: NEGATIVE
Leukocytes, UA: NEGATIVE
Nitrite, UA: NEGATIVE
Protein, UA: NEGATIVE
Spec Grav, UA: 1.015 (ref 1.010–1.025)
Urobilinogen, UA: 0.2 E.U./dL
pH, UA: 6 (ref 5.0–8.0)

## 2023-07-16 NOTE — Progress Notes (Unsigned)
Acute Office Visit  Subjective:     Patient ID: Casey Hansen, female    DOB: Apr 23, 1991, 32 y.o.   MRN: [id number]  Patient is in today for  Chief Complaint  Patient presents with   Follow-up    Headaches, back pain into neck, UTI symptoms, fingers twitching and going numb.    Having headaches.  Start at the back of her neck, go to her temples.  Happening every day, meds haven't changed, nothing else has been different.   UTI symptoms  Sending BV swab      Review of Systems  Genitourinary:  Positive for dysuria and urgency.  Musculoskeletal:  Positive for back pain and neck pain.  All other systems reviewed and are negative.       Objective:    BP 122/78   Pulse 90   Ht 5\' 6"  (1.676 m)   Wt 262 lb 6.4 oz (119 kg)   LMP  (LMP Unknown) Comment: pt has an IUD  SpO2 98%   BMI 42.35 kg/m   Physical Exam Vitals and nursing note reviewed.  Constitutional:      Appearance: Normal appearance. She is obese.  Neurological:     Mental Status: She is alert.     Results for orders placed or performed in visit on 07/16/23  NuSwab Vaginitis Plus (VG+)  Result Value Ref Range   Atopobium vaginae Low - 0 Score   BVAB 2 Low - 0 Score   Megasphaera 1 Low - 0 Score   Candida  albicans, NAA Negative Negative   Candida glabrata, NAA Negative Negative   Trich vag by NAA Negative Negative   Chlamydia trachomatis, NAA Negative Negative   Neisseria gonorrhoeae, NAA Negative Negative  POCT Urinalysis Dipstick (81002)  Result Value Ref Range   Color, UA light yellow    Clarity, UA clear    Glucose, UA Negative Negative   Bilirubin, UA neg    Ketones, UA neg    Spec Grav, UA 1.015 1.010 - 1.025   Blood, UA pos    pH, UA 6.0 5.0 - 8.0   Protein, UA Negative Negative   Urobilinogen, UA 0.2 0.2 or 1.0 E.U./dL   Nitrite, UA neg    Leukocytes, UA Negative Negative   Appearance clear    Odor none     Recent Results (from the past 2160 hour(s))  Comprehensive metabolic panel     Status: Abnormal   Collection Time: 05/11/23  7:38 PM  Result Value Ref Range   Sodium 134 (L) 135 - 145 mmol/L   Potassium 4.0 3.5 - 5.1 mmol/L   Chloride 98 98 - 111 mmol/L   CO2 26 22 - 32 mmol/L   Glucose, Bld 95 70 - 99 mg/dL    Comment: Glucose reference range applies only to samples taken after fasting for at least 8 hours.   BUN 15 6 - 20 mg/dL   Creatinine, Ser 2.02 0.44 - 1.00 mg/dL   Calcium 9.0 8.9 - 54.2 mg/dL   Total Protein 7.1 6.5 - 8.1 g/dL   Albumin 4.0 3.5 - 5.0 g/dL   AST 23 15 - 41 U/L   ALT 30 0 - 44 U/L   Alkaline Phosphatase 95 38 - 126 U/L   Total Bilirubin 0.6 0.3 - 1.2 mg/dL   GFR, Estimated >70 >62 mL/min    Comment: (NOTE) Calculated using the CKD-EPI Creatinine Equation (2021)    Anion gap 10 5 - 15  Comment: Performed at [facility] [address] [place], [place] [address]  Lipase, blood     Status: None   Collection Time: 05/11/23  7:38 PM  Result Value Ref Range   Lipase 38 11 - 51 U/L    Comment: Performed at [facility], [address] [place], [place] [address]  CBC with Diff     Status: Abnormal   Collection Time: 05/11/23  7:38 PM  Result Value Ref Range   WBC 15.1 (H) 4.0 - 10.5 K/uL   RBC 4.98 3.87 - 5.11  MIL/uL   Hemoglobin 14.8 12.0 - 15.0 g/dL   HCT 82.9 56.2 - 13.0 %   MCV 90.2 80.0 - 100.0 fL   MCH 29.7 26.0 - 34.0 pg   MCHC 33.0 30.0 - 36.0 g/dL   RDW 86.5 78.4 - 69.6 %   Platelets 330 150 - 400 K/uL   nRBC 0.0 0.0 - 0.2 %   Neutrophils Relative % 61 %   Neutro Abs 9.5 (H) 1.7 - 7.7 K/uL   Lymphocytes Relative 30 %   Lymphs Abs 4.5 (H) 0.7 - 4.0 K/uL   Monocytes Relative 6 %   Monocytes Absolute 0.8 0.1 - 1.0 K/uL   Eosinophils Relative 1 %   Eosinophils Absolute 0.1 0.0 - 0.5 K/uL   Basophils Relative 1 %   Basophils Absolute 0.1 0.0 - 0.1 K/uL   Immature Granulocytes 1 %   Abs Immature Granulocytes 0.08 (H) 0.00 - 0.07 K/uL    Comment: Performed at [facility], [address] [place], [place] [address]  Urinalysis, Routine w reflex microscopic -Urine, Clean Catch     Status: Abnormal   Collection Time: 05/11/23  7:38 PM  Result Value Ref Range   Color, Urine STRAW (A) YELLOW   APPearance CLEAR (A) CLEAR   Specific Gravity, Urine 1.010 1.005 - 1.030   pH 7.0 5.0 - 8.0   Glucose, UA NEGATIVE NEGATIVE mg/dL   Hgb urine dipstick NEGATIVE NEGATIVE   Bilirubin Urine NEGATIVE NEGATIVE   Ketones, ur NEGATIVE NEGATIVE mg/dL   Protein, ur NEGATIVE NEGATIVE mg/dL   Nitrite NEGATIVE NEGATIVE   Leukocytes,Ua MODERATE (A) NEGATIVE   RBC / HPF 0-5 0 - 5 RBC/hpf   WBC, UA 0-5 0 - 5 WBC/hpf   Bacteria, UA NONE SEEN NONE SEEN   Squamous Epithelial / HPF 0-5 0 - 5 /HPF    Comment: Performed at [facility], [address] [place], [place] [address]  POC urine preg, ED (not at [facility])     Status: None   Collection Time: 05/11/23  7:41 PM  Result Value Ref Range   Preg Test, Ur NEGATIVE NEGATIVE    Comment:        THE SENSITIVITY OF THIS METHODOLOGY IS >24 mIU/mL   Wet prep, genital     Status: None   Collection Time: 05/11/23  8:53 PM  Result Value Ref Range   Yeast Wet Prep HPF POC NONE SEEN NONE SEEN   Trich, Wet Prep NONE SEEN NONE SEEN   Clue Cells Wet  Prep HPF POC NONE SEEN NONE SEEN   WBC, Wet Prep HPF POC <10 <10   Sperm NONE SEEN     Comment: Performed at [facility], [address] [place], [place] [address]  POCT Urinalysis Dipstick [phone])     Status: Abnormal   Collection Time: 05/20/23  3:03 PM  Result Value Ref Range   Color, UA  light yellow    Clarity, UA clear    Glucose, UA Negative Negative   Bilirubin, UA neg    Ketones, UA neg    Spec Grav, UA 1.015 1.010 - 1.025   Blood, UA pos    pH, UA 6.0 5.0 - 8.0   Protein, UA Negative Negative   Urobilinogen, UA 0.2 0.2 or 1.0 E.U./dL   Nitrite, UA neg    Leukocytes, UA Small (1+) (A) Negative   Appearance light yellow    Odor foul   POCT Urinalysis Dipstick (16109)     Status: None   Collection Time: 07/16/23  3:34 PM  Result Value Ref Range   Color, UA light yellow    Clarity, UA clear    Glucose, UA Negative Negative   Bilirubin, UA neg    Ketones, UA neg    Spec Grav, UA 1.015 1.010 - 1.025   Blood, UA pos    pH, UA 6.0 5.0 - 8.0   Protein, UA Negative Negative   Urobilinogen, UA 0.2 0.2 or 1.0 E.U./dL   Nitrite, UA neg    Leukocytes, UA Negative Negative   Appearance clear    Odor none   NuSwab Vaginitis Plus (VG+)     Status: None   Collection Time: 07/16/23  4:12 PM  Result Value Ref Range   Atopobium vaginae Low - 0 Score   BVAB 2 Low - 0 Score   Megasphaera 1 Low - 0 Score    Comment: Calculate total score by adding the 3 individual bacterial vaginosis (BV) marker scores together.  Total score is interpreted as follows: Total score 0-1: Indicates the absence of BV. Total score   2: Indeterminate for BV. Additional clinical                  data should be evaluated to establish a                  diagnosis. Total score 3-6: Indicates the presence of BV.    Candida albicans, NAA Negative Negative   Candida glabrata, NAA Negative Negative   Trich vag by NAA Negative Negative   Chlamydia trachomatis, NAA Negative Negative   Neisseria  gonorrhoeae, NAA Negative Negative       Assessment & Plan:   Problem List Items Addressed This Visit   None Visit Diagnoses     Low back pain, unspecified back pain laterality, unspecified chronicity, unspecified whether sciatica present    -  Primary   Sending muscle relaxer and prednisone for her pain.  Suspect inflammatory process.  Will recheck at follow up   Relevant Medications   baclofen (LIORESAL) 10 MG tablet   predniSONE (DELTASONE) 10 MG tablet   Other Relevant Orders   POCT Urinalysis Dipstick (60454) (Completed)   Cervicalgia       Will get x-ray also sending prednisone and baclofen Will recheck at follow up.   Relevant Orders   DG Cervical Spine Complete (Completed)   Acute vaginitis       sending vaginitis swab.  Will call with results and send meds based on those.   Relevant Orders   NuSwab Vaginitis Plus (VG+) (Completed)        No follow-ups on file.  Total time spent: 20 minutes  Casey AMINOV, FNP  07/16/2023   This document may have been prepared by [facility] Voice Recognition software and as such may include unintentional dictation errors.

## 2023-07-17 ENCOUNTER — Ambulatory Visit
Admission: RE | Admit: 2023-07-17 | Discharge: 2023-07-17 | Disposition: A | Discharge status: Home / Self Care | Payer: Managed Care, Other (non HMO) | Source: Ambulatory Visit | Attending: Family | Admitting: Family

## 2023-07-17 ENCOUNTER — Ambulatory Visit
Admission: RE | Admit: 2023-07-17 | Discharge: 2023-07-17 | Disposition: A | Discharge status: Home / Self Care | Payer: Managed Care, Other (non HMO) | Attending: Family | Admitting: Family

## 2023-07-17 DIAGNOSIS — M542 Cervicalgia: Secondary | ICD-10-CM | POA: Diagnosis present

## 2023-07-17 MED ORDER — BACLOFEN 10 MG PO TABS: 5.0000 mg | ORAL_TABLET | Freq: Two times a day (BID) | ORAL | 1 refills | Status: DC | PRN

## 2023-07-17 MED ORDER — PREDNISONE 10 MG PO TABS: 20.0000 mg | ORAL_TABLET | Freq: Every day | ORAL | 0 refills | Status: DC

## 2023-07-17 NOTE — Progress Notes (Signed)
Spoke with pt who verbalized understanding.

## 2023-07-26 ENCOUNTER — Encounter: Payer: Self-pay | Admitting: Family

## 2023-07-29 ENCOUNTER — Encounter: Payer: Self-pay | Admitting: Family

## 2023-08-03 ENCOUNTER — Other Ambulatory Visit: Payer: Self-pay | Admitting: Cardiology

## 2023-08-03 DIAGNOSIS — F419 Anxiety disorder, unspecified: Secondary | ICD-10-CM

## 2023-08-03 MED ORDER — FLUOXETINE HCL 60 MG PO TABS
60.0000 mg | ORAL_TABLET | Freq: Every day | ORAL | 1 refills | Status: DC
Start: 2023-08-03 — End: 2024-01-27

## 2023-08-06 ENCOUNTER — Ambulatory Visit: Payer: Managed Care, Other (non HMO) | Admitting: Cardiology

## 2023-08-08 ENCOUNTER — Other Ambulatory Visit: Payer: Self-pay | Admitting: Family

## 2023-08-12 ENCOUNTER — Ambulatory Visit
Admission: RE | Admit: 2023-08-12 | Discharge: 2023-08-12 | Disposition: A | Discharge status: Home / Self Care | Payer: Managed Care, Other (non HMO) | Attending: Cardiology | Admitting: Cardiology

## 2023-08-12 ENCOUNTER — Encounter: Payer: Self-pay | Admitting: Cardiology

## 2023-08-12 ENCOUNTER — Ambulatory Visit (INDEPENDENT_AMBULATORY_CARE_PROVIDER_SITE_OTHER): Payer: Managed Care, Other (non HMO) | Admitting: Cardiology

## 2023-08-12 ENCOUNTER — Ambulatory Visit
Admission: RE | Admit: 2023-08-12 | Discharge: 2023-08-12 | Disposition: A | Discharge status: Home / Self Care | Payer: Managed Care, Other (non HMO) | Source: Ambulatory Visit | Attending: Cardiology

## 2023-08-12 VITALS — BP 108/78 | HR 92 | Ht 65.0 in | Wt 260.0 lb

## 2023-08-12 DIAGNOSIS — E782 Mixed hyperlipidemia: Secondary | ICD-10-CM

## 2023-08-12 DIAGNOSIS — R519 Headache, unspecified: Secondary | ICD-10-CM | POA: Diagnosis not present

## 2023-08-12 DIAGNOSIS — R251 Tremor, unspecified: Secondary | ICD-10-CM | POA: Diagnosis not present

## 2023-08-12 DIAGNOSIS — M546 Pain in thoracic spine: Secondary | ICD-10-CM | POA: Insufficient documentation

## 2023-08-12 LAB — POCT CBG (FASTING - GLUCOSE)-MANUAL ENTRY: Glucose Fasting, POC: 74 mg/dL (ref 70–99)

## 2023-08-12 MED ORDER — MELOXICAM 15 MG PO TABS: 15.0000 mg | ORAL_TABLET | Freq: Every day | ORAL | 3 refills | Status: DC

## 2023-08-12 NOTE — Progress Notes (Addendum)
Established Patient Office Visit  Subjective:  Patient ID: Casey Hansen, female    DOB: 1991-06-07  Age: 32 y.o. MRN: [id number]  Chief Complaint  Patient presents with   Acute Visit    Constant headaches and back pain    Patient in office complaining of headaches and back pain. Previous cervical spine xray showed Straightening of cervical lordosis without subluxation. This may be due to muscle spasm. Patient complains of pain in the thoracic region. Will order a thoracic spine xray. Patient continues to have frequent headaches. Patient states headaches wake her up during the night. Headaches are worse when coughing. Occasional nausea/ and lightheadedness with headaches. Will order lab work, CT scan of the head. Return to discuss.   Headache  This is a new problem. The current episode started more than 1 month ago. The problem occurs daily. The problem has been gradually worsening. The pain is located in the Occipital, parietal and temporal region. The pain does not radiate. Quality: pressure. Associated symptoms include back pain and nausea. Pertinent negatives include no abdominal pain, dizziness, numbness or tingling. Associated symptoms comments: lightheaded. The symptoms are aggravated by bright light and activity. She has tried acetaminophen and NSAIDs for the symptoms. The treatment provided mild relief. Her past medical history is significant for migraine headaches.  Back Pain This is a new problem. The current episode started 1 to 4 weeks ago. The problem occurs constantly. The problem has been gradually worsening since onset. The pain is present in the thoracic spine. The quality of the pain is described as aching. The symptoms are aggravated by bending and position. Associated symptoms include headaches. Pertinent negatives include no abdominal pain, chest pain, numbness, paresis, paresthesias or tingling.    No other concerns at this time.   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   Constipation 05/06/2017   Depression with anxiety 02/04/2012   Note: Unchanged     GERD (gastroesophageal reflux disease)    Headache    Migraines/sinus   Hyperlipidemia    Hypertension    Sprain of ankle 11/09/2017   Vertigo 10/02/2016    Past Surgical History:  Procedure Laterality Date   CHOLECYSTECTOMY N/A 04/22/2016   Procedure: LAPAROSCOPIC CHOLECYSTECTOMY WITH INTRAOPERATIVE CHOLANGIOGRAM;  Surgeon: Leafy Ro, MD;  Location: [facility] ORS;  Service: General;  Laterality: N/A;   ENDOSCOPIC RETROGRADE CHOLANGIOPANCREATOGRAPHY (ERCP) WITH PROPOFOL N/A 04/24/2016   Procedure: ENDOSCOPIC RETROGRADE CHOLANGIOPANCREATOGRAPHY (ERCP) WITH PROPOFOL;  Surgeon: Midge Minium, MD;  Location: [facility] END[facility]COPY;  Service: Endoscopy;  Laterality: N/A;   ESOPHAGOGASTRODUODENOSCOPY (EGD) WITH PROPOFOL N/A 12/21/2015   Procedure: ESOPHAGOGASTRODUODENOSCOPY (EGD) WITH PROPOFOL;  Surgeon: Wallace Cullens, MD;  Location: [facility];  Service: Gastroenterology;  Laterality: N/A;   IMAGE GUIDED SINUS SURGERY N/A 12/26/2016   Procedure: IMAGE GUIDED SINUS SURGERY;  Surgeon: Linus Salmons, MD;  Location: [facility];  Service: ENT;  Laterality: N/A;   MAXILLARY ANTROSTOMY Left 12/26/2016   Procedure: MAXILLARY ANTROSTOMY;  Surgeon: Linus Salmons, MD;  Location: [facility];  Service: ENT;  Laterality: Left;   SEPTOPLASTY N/A 12/26/2016   Procedure: SEPTOPLASTY;  Surgeon: Linus Salmons, MD;  Location: [facility];  Service: ENT;  Laterality: N/A;   TURBINATE REDUCTION Bilateral 12/26/2016   Procedure: Frederik Schmidt REDUCTION;  Surgeon: Linus Salmons, MD;  Location: [facility];  Service: ENT;  Laterality: Bilateral;    Social History   Socioeconomic History   Marital status: Married  Spouse name: Not on file   Number of children: Not on file   Years of education: Not on file   Highest education level: Not on file   Occupational History   Not on file  Tobacco Use   Smoking status: Never   Smokeless tobacco: Never  Substance and Sexual Activity   Alcohol use: No   Drug use: No   Sexual activity: Not on file  Other Topics Concern   Not on file  Social History Narrative   Not on file   Social Determinants of Health   Financial Resource Strain: Not on file  Food Insecurity: Not on file  Transportation Needs: Not on file  Physical Activity: Not on file  Stress: Not on file  Social Connections: Not on file  Intimate Partner Violence: Not on file    Family History  Problem Relation Age of Onset   Heart disease Mother    Diabetes Father    Hypertension Father    Cancer Paternal Grandmother        breast   Breast cancer Paternal Grandmother    Breast cancer Paternal Aunt     No Known Allergies  Review of Systems  Constitutional: Negative.   HENT: Negative.    Eyes: Negative.   Respiratory: Negative.  Negative for shortness of breath.   Cardiovascular: Negative.  Negative for chest pain.  Gastrointestinal:  Positive for nausea. Negative for abdominal pain, constipation and diarrhea.  Genitourinary: Negative.   Musculoskeletal:  Positive for back pain. Negative for joint pain and myalgias.  Skin: Negative.   Neurological:  Positive for headaches. Negative for dizziness, tingling, numbness and paresthesias.  Endo/Heme/Allergies: Negative.   All other systems reviewed and are negative.      Objective:   BP 108/78   Pulse 92   Ht 5\' 5"  (1.651 m)   Wt 260 lb (117.9 kg)   LMP  (LMP Unknown) Comment: pt has an IUD  SpO2 96%   BMI 43.27 kg/m   Vitals:   08/12/23 1256  BP: 108/78  Pulse: 92  Height: 5\' 5"  (1.651 m)  Weight: 260 lb (117.9 kg)  SpO2: 96%  BMI (Calculated): 43.27    Physical Exam Vitals and nursing note reviewed.  Constitutional:      Appearance: Normal appearance. She is normal weight.  HENT:     Head: Normocephalic and atraumatic.     Nose: Nose  normal.     Mouth/Throat:     Mouth: Mucous membranes are moist.  Eyes:     Extraocular Movements: Extraocular movements intact.     Conjunctiva/sclera: Conjunctivae normal.     Pupils: Pupils are equal, round, and reactive to light.  Cardiovascular:     Rate and Rhythm: Normal rate and regular rhythm.     Pulses: Normal pulses.     Heart sounds: Normal heart sounds.  Pulmonary:     Effort: Pulmonary effort is normal.     Breath sounds: Normal breath sounds.  Abdominal:     General: Abdomen is flat. Bowel sounds are normal.     Palpations: Abdomen is soft.  Musculoskeletal:        General: Normal range of motion.     Cervical back: Normal range of motion.  Skin:    General: Skin is warm and dry.  Neurological:     General: No focal deficit present.     Mental Status: She is alert and oriented to person, place, and time.  Psychiatric:  Mood and Affect: Mood normal.        Behavior: Behavior normal.        Thought Content: Thought content normal.        Judgment: Judgment normal.      Results for orders placed or performed in visit on 08/12/23  POCT CBG (Fasting - Glucose)  Result Value Ref Range   Glucose Fasting, POC 74 70 - 99 mg/dL    Recent Results (from the past 2160 hour(s))  POCT Urinalysis Dipstick (86578)     Status: None   Collection Time: 07/16/23  3:34 PM  Result Value Ref Range   Color, UA light yellow    Clarity, UA clear    Glucose, UA Negative Negative   Bilirubin, UA neg    Ketones, UA neg    Spec Grav, UA 1.015 1.010 - 1.025   Blood, UA pos    pH, UA 6.0 5.0 - 8.0   Protein, UA Negative Negative   Urobilinogen, UA 0.2 0.2 or 1.0 E.U./dL   Nitrite, UA neg    Leukocytes, UA Negative Negative   Appearance clear    Odor none   NuSwab Vaginitis Plus (VG+)     Status: None   Collection Time: 07/16/23  4:12 PM  Result Value Ref Range   Atopobium vaginae Low - 0 Score   BVAB 2 Low - 0 Score   Megasphaera 1 Low - 0 Score    Comment:  Calculate total score by adding the 3 individual bacterial vaginosis (BV) marker scores together.  Total score is interpreted as follows: Total score 0-1: Indicates the absence of BV. Total score   2: Indeterminate for BV. Additional clinical                  data should be evaluated to establish a                  diagnosis. Total score 3-6: Indicates the presence of BV.    Candida albicans, NAA Negative Negative   Candida glabrata, NAA Negative Negative   Trich vag by NAA Negative Negative   Chlamydia trachomatis, NAA Negative Negative   Neisseria gonorrhoeae, NAA Negative Negative  POCT CBG (Fasting - Glucose)     Status: None   Collection Time: 08/12/23  1:11 PM  Result Value Ref Range   Glucose Fasting, POC 74 70 - 99 mg/dL  TSH     Status: None   Collection Time: 08/14/23  8:41 AM  Result Value Ref Range   TSH 0.797 0.450 - 4.500 uIU/mL  Vitamin B12     Status: None   Collection Time: 08/14/23  8:41 AM  Result Value Ref Range   Vitamin B-12 429 232 - 1,245 pg/mL  Hemoglobin A1c     Status: None   Collection Time: 08/14/23  8:41 AM  Result Value Ref Range   Hgb A1c MFr Bld 5.3 4.8 - 5.6 %    Comment:          Prediabetes: 5.7 - 6.4          Diabetes: >6.4          Glycemic control for adults with diabetes: <7.0    Est. average glucose Bld gHb Est-mCnc 105 mg/dL  CBC with Differential/Platelet     Status: None   Collection Time: 08/14/23  8:41 AM  Result Value Ref Range   WBC 8.1 3.4 - 10.8 x10E3/uL   RBC 4.89 3.77 - 5.28 x10E6/uL  Hemoglobin 15.0 11.1 - 15.9 g/dL   Hematocrit 78.2 95.6 - 46.6 %   MCV 90 79 - 97 fL   MCH 30.7 26.6 - 33.0 pg   MCHC 34.1 31.5 - 35.7 g/dL   RDW 21.3 08.6 - 57.8 %   Platelets 333 150 - 450 x10E3/uL   Neutrophils 68 Not Estab. %   Lymphs 24 Not Estab. %   Monocytes 6 Not Estab. %   Eos 1 Not Estab. %   Basos 1 Not Estab. %   Neutrophils Absolute 5.6 1.4 - 7.0 x10E3/uL   Lymphocytes Absolute 1.9 0.7 - 3.1 x10E3/uL   Monocytes  Absolute 0.5 0.1 - 0.9 x10E3/uL   EOS (ABSOLUTE) 0.1 0.0 - 0.4 x10E3/uL   Basophils Absolute 0.1 0.0 - 0.2 x10E3/uL   Immature Granulocytes 0 Not Estab. %   Immature Grans (Abs) 0.0 0.0 - 0.1 x10E3/uL  CMP14+EGFR     Status: None   Collection Time: 08/14/23  8:41 AM  Result Value Ref Range   Glucose 83 70 - 99 mg/dL   BUN 9 6 - 20 mg/dL   Creatinine, Ser 4.69 0.57 - 1.00 mg/dL   eGFR 99 >62 XB/MWU/1.32   BUN/Creatinine Ratio 11 9 - 23   Sodium 139 134 - 144 mmol/L   Potassium 4.3 3.5 - 5.2 mmol/L   Chloride 103 96 - 106 mmol/L   CO2 23 20 - 29 mmol/L   Calcium 9.3 8.7 - 10.2 mg/dL   Total Protein 6.6 6.0 - 8.5 g/dL   Albumin 4.2 3.9 - 4.9 g/dL   Globulin, Total 2.4 1.5 - 4.5 g/dL   Bilirubin Total 0.4 0.0 - 1.2 mg/dL   Alkaline Phosphatase 119 44 - 121 IU/L   AST 17 0 - 40 IU/L   ALT 20 0 - 32 IU/L  Lipid Profile     Status: None   Collection Time: 08/14/23  8:41 AM  Result Value Ref Range   Cholesterol, Total 121 100 - 199 mg/dL   Triglycerides 440 0 - 149 mg/dL   HDL 44 >10 mg/dL   VLDL Cholesterol Cal 20 5 - 40 mg/dL   LDL Chol Calc (NIH) 57 0 - 99 mg/dL   Chol/HDL Ratio 2.8 0.0 - 4.4 ratio    Comment:                                   T. Chol/HDL Ratio                                             Men  Women                               1/2 Avg.Risk  3.4    3.3                                   Avg.Risk  5.0    4.4                                2X Avg.Risk  9.6    7.1  3X Avg.Risk 23.4   11.0       Assessment & Plan:  Thoracic spine xray. Ct scan of the head.  Lab work.  Return to discuss.  Problem List Items Addressed This Visit       Other   Hyperlipidemia   Relevant Medications   rosuvastatin (CRESTOR) 20 MG tablet   spironolactone (ALDACTONE) 50 MG tablet   Other Relevant Orders   Lipid Profile (Completed)   Other Visit Diagnoses     Shakiness    -  Primary   Relevant Orders   POCT CBG (Fasting - Glucose)  (Completed)   Hemoglobin A1c (Completed)   TSH (Completed)   Nonintractable headache, unspecified chronicity pattern, unspecified headache type       Relevant Medications   meloxicam (MOBIC) 15 MG tablet   Other Relevant Orders   CT HEAD W & WO CONTRAST ( )   CMP14+EGFR (Completed)   CBC with Differential/Platelet (Completed)   Hemoglobin A1c (Completed)   Vitamin B12 (Completed)   TSH (Completed)   Acute bilateral thoracic back pain       Relevant Medications   meloxicam (MOBIC) 15 MG tablet   Other Relevant Orders   DG Thoracic Spine 2 View (Completed)       Return in about 2 weeks (around 08/26/2023).   Total time spent: 25 minutes  [employer], NP  08/12/2023   This document may have been prepared by Dragon Voice Recognition software and as such may include unintentional dictation errors.

## 2023-08-13 NOTE — Progress Notes (Signed)
Patient notified

## 2023-08-14 ENCOUNTER — Other Ambulatory Visit: Payer: Managed Care, Other (non HMO)

## 2023-08-14 DIAGNOSIS — R251 Tremor, unspecified: Secondary | ICD-10-CM

## 2023-08-14 DIAGNOSIS — E782 Mixed hyperlipidemia: Secondary | ICD-10-CM

## 2023-08-14 DIAGNOSIS — R519 Headache, unspecified: Secondary | ICD-10-CM

## 2023-08-15 LAB — CMP14+EGFR
ALT: 20 IU/L (ref 0–32)
AST: 17 IU/L (ref 0–40)
Albumin: 4.2 g/dL (ref 3.9–4.9)
Alkaline Phosphatase: 119 IU/L (ref 44–121)
BUN/Creatinine Ratio: 11 (ref 9–23)
BUN: 9 mg/dL (ref 6–20)
Bilirubin Total: 0.4 mg/dL (ref 0.0–1.2)
CO2: 23 mmol/L (ref 20–29)
Calcium: 9.3 mg/dL (ref 8.7–10.2)
Chloride: 103 mmol/L (ref 96–106)
Creatinine, Ser: 0.8 mg/dL (ref 0.57–1.00)
Globulin, Total: 2.4 g/dL (ref 1.5–4.5)
Glucose: 83 mg/dL (ref 70–99)
Potassium: 4.3 mmol/L (ref 3.5–5.2)
Sodium: 139 mmol/L (ref 134–144)
Total Protein: 6.6 g/dL (ref 6.0–8.5)
eGFR: 99 mL/min/{1.73_m2} (ref >59)

## 2023-08-15 LAB — LIPID PANEL
Chol/HDL Ratio: 2.8 ratio (ref 0.0–4.4)
Cholesterol, Total: 121 mg/dL (ref 100–199)
HDL: 44 mg/dL (ref >39)
LDL Chol Calc (NIH): 57 mg/dL (ref 0–99)
Triglycerides: 106 mg/dL (ref 0–149)
VLDL Cholesterol Cal: 20 mg/dL (ref 5–40)

## 2023-08-15 LAB — HEMOGLOBIN A1C
Est. average glucose Bld gHb Est-mCnc: 105 mg/dL
Hgb A1c MFr Bld: 5.3 % (ref 4.8–5.6)

## 2023-08-15 LAB — CBC WITH DIFFERENTIAL/PLATELET
Basophils Absolute: 0.1 10*3/uL (ref 0.0–0.2)
Basos: 1 %
EOS (ABSOLUTE): 0.1 10*3/uL (ref 0.0–0.4)
Eos: 1 %
Hematocrit: 44 % (ref 34.0–46.6)
Hemoglobin: 15 g/dL (ref 11.1–15.9)
Immature Grans (Abs): 0 10*3/uL (ref 0.0–0.1)
Immature Granulocytes: 0 %
Lymphocytes Absolute: 1.9 10*3/uL (ref 0.7–3.1)
Lymphs: 24 %
MCH: 30.7 pg (ref 26.6–33.0)
MCHC: 34.1 g/dL (ref 31.5–35.7)
MCV: 90 fL (ref 79–97)
Monocytes Absolute: 0.5 10*3/uL (ref 0.1–0.9)
Monocytes: 6 %
Neutrophils Absolute: 5.6 10*3/uL (ref 1.4–7.0)
Neutrophils: 68 %
Platelets: 333 10*3/uL (ref 150–450)
RBC: 4.89 x10E6/uL (ref 3.77–5.28)
RDW: 12 % (ref 11.7–15.4)
WBC: 8.1 10*3/uL (ref 3.4–10.8)

## 2023-08-15 LAB — VITAMIN B12: Vitamin B-12: 429 pg/mL (ref 232–1245)

## 2023-08-15 LAB — TSH: TSH: 0.797 u[IU]/mL (ref 0.450–4.500)

## 2023-08-18 NOTE — Progress Notes (Signed)
Patient notified

## 2023-08-25 ENCOUNTER — Other Ambulatory Visit: Payer: Managed Care, Other (non HMO)

## 2023-08-27 ENCOUNTER — Telehealth: Payer: Self-pay | Admitting: Cardiology

## 2023-08-27 ENCOUNTER — Ambulatory Visit: Payer: Managed Care, Other (non HMO) | Admitting: Cardiology

## 2023-08-27 NOTE — Telephone Encounter (Signed)
Patient called in saying that she received a letter from insurance about denying her CT scan and the letter states that if you have headaches that wake you up, when you bare down and cough have a headache, and nausea/lightheaded with headaches that the CT would be approved. Patient reports that she has all of these symptoms. Can we amend her office note and try to resubmit for CT or can we prescribe something for her headaches? Maybe a rescue med like Ubrelvy or Nurtec.   [facility] [address]

## 2023-09-15 ENCOUNTER — Ambulatory Visit (INDEPENDENT_AMBULATORY_CARE_PROVIDER_SITE_OTHER): Payer: Managed Care, Other (non HMO)

## 2023-09-15 DIAGNOSIS — R519 Headache, unspecified: Secondary | ICD-10-CM

## 2023-09-15 MED ORDER — IOHEXOL 300 MG/ML  SOLN
100.0000 mL | Freq: Once | INTRAMUSCULAR | Status: AC | PRN
  Administered 2023-09-15: 100 mL via INTRAVENOUS

## 2023-09-20 ENCOUNTER — Other Ambulatory Visit: Payer: Self-pay | Admitting: Family

## 2023-09-21 ENCOUNTER — Ambulatory Visit: Payer: Managed Care, Other (non HMO) | Admitting: Family

## 2023-09-21 VITALS — BP 130/85 | HR 84 | Ht 65.0 in | Wt 263.6 lb

## 2023-09-21 DIAGNOSIS — G8929 Other chronic pain: Secondary | ICD-10-CM

## 2023-09-21 DIAGNOSIS — M549 Dorsalgia, unspecified: Secondary | ICD-10-CM | POA: Diagnosis not present

## 2023-09-21 DIAGNOSIS — Z0130 Encounter for examination of blood pressure without abnormal findings: Secondary | ICD-10-CM

## 2023-09-21 MED ORDER — TIZANIDINE HCL 4 MG PO TABS: 4.0000 mg | ORAL_TABLET | Freq: Three times a day (TID) | ORAL | 1 refills | Status: DC

## 2023-09-21 NOTE — Progress Notes (Signed)
Established Patient Office Visit  Subjective:  Patient ID: Casey Hansen, female    DOB: May 10, 1991  Age: 32 y.o. MRN: [id number]  Chief Complaint  Patient presents with   Follow-up    6 mo f/u    Pt. Here today for follow up appointment.  Her Back is still hurting, and she is still having headaches too.   Says that it feels tight, that she feels like she needs to pop it all the time, thinks this might be what is causing her headache.       No other concerns at this time.   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   Constipation 05/06/2017   Depression with anxiety 02/04/2012   Note: Unchanged     GERD (gastroesophageal reflux disease)    Headache    Migraines/sinus   Hyperlipidemia    Hypertension    Sprain of ankle 11/09/2017   Vertigo 10/02/2016    Past Surgical History:  Procedure Laterality Date   CHOLECYSTECTOMY N/A 04/22/2016   Procedure: LAPAROSCOPIC CHOLECYSTECTOMY WITH INTRAOPERATIVE CHOLANGIOGRAM;  Surgeon: Leafy Ro, MD;  Location: [facility];  Service: General;  Laterality: N/A;   ENDOSCOPIC RETROGRADE CHOLANGIOPANCREATOGRAPHY (ERCP) WITH PROPOFOL N/A 04/24/2016   Procedure: ENDOSCOPIC RETROGRADE CHOLANGIOPANCREATOGRAPHY (ERCP) WITH PROPOFOL;  Surgeon: Midge Minium, MD;  Location: [facility] END[facility];  Service: Endoscopy;  Laterality: N/A;   ESOPHAGOGASTRODUODENOSCOPY (EGD) WITH PROPOFOL N/A 12/21/2015   Procedure: ESOPHAGOGASTRODUODENOSCOPY (EGD) WITH PROPOFOL;  Surgeon: Wallace Cullens, MD;  Location: [facility] ENDOSCOPY;  Service: Gastroenterology;  Laterality: N/A;   IMAGE GUIDED SINUS SURGERY N/A 12/26/2016   Procedure: IMAGE GUIDED SINUS SURGERY;  Surgeon: Linus Salmons, MD;  Location: [facility];  Service: ENT;  Laterality: N/A;   MAXILLARY ANTROSTOMY Left 12/26/2016   Procedure: MAXILLARY ANTROSTOMY;  Surgeon: Linus Salmons, MD;  Location: [facility];  Service: ENT;  Laterality: Left;    SEPTOPLASTY N/A 12/26/2016   Procedure: SEPTOPLASTY;  Surgeon: Linus Salmons, MD;  Location: [facility];  Service: ENT;  Laterality: N/A;   TURBINATE REDUCTION Bilateral 12/26/2016   Procedure: Frederik Schmidt REDUCTION;  Surgeon: Linus Salmons, MD;  Location: [facility];  Service: ENT;  Laterality: Bilateral;    Social History   Socioeconomic History   Marital status: Married    Spouse name: Not on file   Number of children: Not on file   Years of education: Not on file   Highest education level: Not on file  Occupational History   Not on file  Tobacco Use   Smoking status: Never   Smokeless tobacco: Never  Substance and Sexual Activity   Alcohol use: No   Drug use: No   Sexual activity: Not on file  Other Topics Concern   Not on file  Social History Narrative   Not on file   Social Determinants of Health   Financial Resource Strain: Not on file  Food Insecurity: Not on file  Transportation Needs: Not on file  Physical Activity: Not on file  Stress: Not on file  Social Connections: Not on file  Intimate Partner Violence: Not on file    Family History  Problem Relation Age of Onset   Heart disease Mother    Diabetes Father    Hypertension Father    Cancer Paternal Grandmother        breast   Breast cancer Paternal Grandmother    Breast cancer Paternal Aunt     No Known  Allergies  Review of Systems  Musculoskeletal:  Positive for back pain and neck pain.  Neurological:  Positive for headaches.  All other systems reviewed and are negative.      Objective:   BP 130/85   Pulse 84   Ht 5\' 5"  (1.651 m)   Wt 263 lb 9.6 oz (119.6 kg)   LMP  (LMP Unknown) Comment: pt has an IUD  SpO2 96%   BMI 43.87 kg/m   Vitals:   09/21/23 1318  BP: 130/85  Pulse: 84  Height: 5\' 5"  (1.651 m)  Weight: 263 lb 9.6 oz (119.6 kg)  SpO2: 96%  BMI (Calculated): 43.87    Physical Exam Vitals and nursing note reviewed.  Constitutional:      Appearance:  Normal appearance. She is normal weight.  HENT:     Head: Normocephalic.  Eyes:     Extraocular Movements: Extraocular movements intact.     Conjunctiva/sclera: Conjunctivae normal.     Pupils: Pupils are equal, round, and reactive to light.  Cardiovascular:     Rate and Rhythm: Normal rate.  Pulmonary:     Effort: Pulmonary effort is normal.  Musculoskeletal:     Right shoulder: Tenderness present.     Left shoulder: Tenderness present.     Cervical back: Spasms and tenderness present.  Neurological:     General: No focal deficit present.     Mental Status: She is alert and oriented to person, place, and time. Mental status is at baseline.  Psychiatric:        Mood and Affect: Mood normal.        Behavior: Behavior normal.        Thought Content: Thought content normal.        Judgment: Judgment normal.      No results found for any visits on 09/21/23.  Recent Results (from the past 2160 hour(s))  POCT CBG (Fasting - Glucose)     Status: None   Collection Time: 08/12/23  1:11 PM  Result Value Ref Range   Glucose Fasting, POC 74 70 - 99 mg/dL  TSH     Status: None   Collection Time: 08/14/23  8:41 AM  Result Value Ref Range   TSH 0.797 0.450 - 4.500 uIU/mL  Vitamin B12     Status: None   Collection Time: 08/14/23  8:41 AM  Result Value Ref Range   Vitamin B-12 429 232 - 1,245 pg/mL  Hemoglobin A1c     Status: None   Collection Time: 08/14/23  8:41 AM  Result Value Ref Range   Hgb A1c MFr Bld 5.3 4.8 - 5.6 %    Comment:          Prediabetes: 5.7 - 6.4          Diabetes: >6.4          Glycemic control for adults with diabetes: <7.0    Est. average glucose Bld gHb Est-mCnc 105 mg/dL  CBC with Differential/Platelet     Status: None   Collection Time: 08/14/23  8:41 AM  Result Value Ref Range   WBC 8.1 3.4 - 10.8 x10E3/uL   RBC 4.89 3.77 - 5.28 x10E6/uL   Hemoglobin 15.0 11.1 - 15.9 g/dL   Hematocrit 01.0 93.2 - 46.6 %   MCV 90 79 - 97 fL   MCH 30.7 26.6 - 33.0  pg   MCHC 34.1 31.5 - 35.7 g/dL   RDW 35.5 73.2 - 20.2 %   Platelets 333  150 - 450 x10E3/uL   Neutrophils 68 Not Estab. %   Lymphs 24 Not Estab. %   Monocytes 6 Not Estab. %   Eos 1 Not Estab. %   Basos 1 Not Estab. %   Neutrophils Absolute 5.6 1.4 - 7.0 x10E3/uL   Lymphocytes Absolute 1.9 0.7 - 3.1 x10E3/uL   Monocytes Absolute 0.5 0.1 - 0.9 x10E3/uL   EOS (ABSOLUTE) 0.1 0.0 - 0.4 x10E3/uL   Basophils Absolute 0.1 0.0 - 0.2 x10E3/uL   Immature Granulocytes 0 Not Estab. %   Immature Grans (Abs) 0.0 0.0 - 0.1 x10E3/uL  CMP14+EGFR     Status: None   Collection Time: 08/14/23  8:41 AM  Result Value Ref Range   Glucose 83 70 - 99 mg/dL   BUN 9 6 - 20 mg/dL   Creatinine, Ser 2.84 0.57 - 1.00 mg/dL   eGFR 99 >13 KG/MWN/0.27   BUN/Creatinine Ratio 11 9 - 23   Sodium 139 134 - 144 mmol/L   Potassium 4.3 3.5 - 5.2 mmol/L   Chloride 103 96 - 106 mmol/L   CO2 23 20 - 29 mmol/L   Calcium 9.3 8.7 - 10.2 mg/dL   Total Protein 6.6 6.0 - 8.5 g/dL   Albumin 4.2 3.9 - 4.9 g/dL   Globulin, Total 2.4 1.5 - 4.5 g/dL   Bilirubin Total 0.4 0.0 - 1.2 mg/dL   Alkaline Phosphatase 119 44 - 121 IU/L   AST 17 0 - 40 IU/L   ALT 20 0 - 32 IU/L  Lipid Profile     Status: None   Collection Time: 08/14/23  8:41 AM  Result Value Ref Range   Cholesterol, Total 121 100 - 199 mg/dL   Triglycerides 253 0 - 149 mg/dL   HDL 44 >66 mg/dL   VLDL Cholesterol Cal 20 5 - 40 mg/dL   LDL Chol Calc (NIH) 57 0 - 99 mg/dL   Chol/HDL Ratio 2.8 0.0 - 4.4 ratio    Comment:                                   T. Chol/HDL Ratio                                             Men  Women                               1/2 Avg.Risk  3.4    3.3                                   Avg.Risk  5.0    4.4                                2X Avg.Risk  9.6    7.1                                3X Avg.Risk 23.4   11.0        Assessment & Plan:   Problem List Items Addressed This Visit  None Visit Diagnoses     Chronic upper back  pain    -  Primary   Will set pt up with PT for treatment.  I will also try to look at her CT scan and see if there is anything I see that could cause her symptoms.   Relevant Medications   tiZANidine (ZANAFLEX) 4 MG tablet   Other Relevant Orders   Ambulatory referral to Physical Therapy       Return in about 6 weeks (around 11/02/2023) for F/U.   Total time spent: 20 minutes  MAKYNLI VASICEK, FNP  09/21/2023   This document may have been prepared by [facility] Voice Recognition software and as such may include unintentional dictation errors.

## 2023-09-26 ENCOUNTER — Encounter: Payer: Self-pay | Admitting: Family

## 2023-09-26 MED ORDER — VALACYCLOVIR HCL 1 G PO TABS: 1000.0000 mg | ORAL_TABLET | Freq: Three times a day (TID) | ORAL | 0 refills | Status: AC

## 2023-10-05 ENCOUNTER — Encounter: Payer: Self-pay | Admitting: Family

## 2023-10-08 ENCOUNTER — Ambulatory Visit: Payer: Managed Care, Other (non HMO) | Admitting: Nurse Practitioner

## 2023-10-18 ENCOUNTER — Other Ambulatory Visit: Payer: Self-pay | Admitting: Family

## 2023-10-25 ENCOUNTER — Encounter: Payer: Self-pay | Admitting: Family

## 2023-10-29 ENCOUNTER — Other Ambulatory Visit: Payer: Self-pay

## 2023-10-30 MED ORDER — ROSUVASTATIN CALCIUM 20 MG PO TABS: 20.0000 mg | ORAL_TABLET | Freq: Every day | ORAL | 0 refills | Status: DC

## 2023-11-02 ENCOUNTER — Ambulatory Visit: Payer: Managed Care, Other (non HMO) | Admitting: Family

## 2023-11-16 ENCOUNTER — Encounter: Payer: Self-pay | Admitting: Family

## 2023-11-16 ENCOUNTER — Ambulatory Visit: Payer: Managed Care, Other (non HMO) | Admitting: Family

## 2023-11-16 VITALS — BP 118/80 | HR 101 | Ht 65.0 in | Wt 253.8 lb

## 2023-11-16 DIAGNOSIS — M549 Dorsalgia, unspecified: Secondary | ICD-10-CM

## 2023-11-16 DIAGNOSIS — Z0130 Encounter for examination of blood pressure without abnormal findings: Secondary | ICD-10-CM

## 2023-11-16 DIAGNOSIS — M542 Cervicalgia: Secondary | ICD-10-CM

## 2023-11-16 DIAGNOSIS — M546 Pain in thoracic spine: Secondary | ICD-10-CM | POA: Diagnosis not present

## 2023-11-16 DIAGNOSIS — G8929 Other chronic pain: Secondary | ICD-10-CM

## 2023-11-16 MED ORDER — CELECOXIB 100 MG PO CAPS: 100.0000 mg | ORAL_CAPSULE | Freq: Two times a day (BID) | ORAL | 0 refills | Status: DC

## 2023-11-16 NOTE — Progress Notes (Signed)
Established Patient Office Visit  Subjective:  Patient ID: Casey Hansen, female    DOB: [birth year]  Age: 32 y.o. MRN: [id number]  Chief Complaint  Patient presents with   Follow-up    6 week    Patient is here today for her 6 week follow up.  She has been feeling poorly since last appointment.   She does have additional concerns to discuss today.  Has been doing physical therapy and has had xrays Used Nsaids and muscle relaxers, still having severe back pain.  She has been doing these measures for 6 weeks and has only had minimal relief.   Need MRI of cervical and thoracic spine.   Labs are not due today. She needs refills.   I have reviewed her active problem list, medication list, allergies, health maintenance, notes from last encounter, lab results for her appointment today.   No other concerns at this time.   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   Constipation 05/06/2017   Depression with anxiety 02/04/2012   Note: Unchanged     GERD (gastroesophageal reflux disease)    Headache    Migraines/sinus   Hyperlipidemia    Hypertension    Sprain of ankle 11/09/2017   Vertigo 10/02/2016    Past Surgical History:  Procedure Laterality Date   CHOLECYSTECTOMY N/A 04/22/2016   Procedure: LAPAROSCOPIC CHOLECYSTECTOMY WITH INTRAOPERATIVE CHOLANGIOGRAM;  Surgeon: Leafy Ro, MD;  Location: [facility] ORS;  Service: General;  Laterality: N/A;   ENDOSCOPIC RETROGRADE CHOLANGIOPANCREATOGRAPHY (ERCP) WITH PROPOFOL N/A 04/24/2016   Procedure: ENDOSCOPIC RETROGRADE CHOLANGIOPANCREATOGRAPHY (ERCP) WITH PROPOFOL;  Surgeon: Midge Minium, MD;  Location: [facility] ENDOSCOPY;  Service: Endoscopy;  Laterality: N/A;   ESOPHAGOGASTRODUODENOSCOPY (EGD) WITH PROPOFOL N/A 12/21/2015   Procedure: ESOPHAGOGASTRODUODENOSCOPY (EGD) WITH PROPOFOL;  Surgeon: Wallace Cullens, MD;  Location: [facility] ENDOSCOPY;  Service: Gastroenterology;  Laterality: N/A;   IMAGE  GUIDED SINUS SURGERY N/A 12/26/2016   Procedure: IMAGE GUIDED SINUS SURGERY;  Surgeon: Linus Salmons, MD;  Location: [facility];  Service: ENT;  Laterality: N/A;   MAXILLARY ANTROSTOMY Left 12/26/2016   Procedure: MAXILLARY ANTROSTOMY;  Surgeon: Linus Salmons, MD;  Location: [facility];  Service: ENT;  Laterality: Left;   SEPTOPLASTY N/A 12/26/2016   Procedure: SEPTOPLASTY;  Surgeon: Linus Salmons, MD;  Location: [facility];  Service: ENT;  Laterality: N/A;   TURBINATE REDUCTION Bilateral 12/26/2016   Procedure: Frederik Schmidt REDUCTION;  Surgeon: Linus Salmons, MD;  Location: [facility];  Service: ENT;  Laterality: Bilateral;    Social History   Socioeconomic History   Marital status: Married    Spouse name: Not on file   Number of children: Not on file   Years of education: Not on file   Highest education level: Not on file  Occupational History   Not on file  Tobacco Use   Smoking status: Never   Smokeless tobacco: Never  Substance and Sexual Activity   Alcohol use: No   Drug use: No   Sexual activity: Not on file  Other Topics Concern   Not on file  Social History Narrative   Not on file   Social Determinants of Health   Financial Resource Strain: Not on file  Food Insecurity: Not on file  Transportation Needs: Not on file  Physical Activity: Not on file  Stress: Not on file  Social Connections: Not on file  Intimate Partner Violence: Not on file    Family  History  Problem Relation Age of Onset   Heart disease Mother    Diabetes Father    Hypertension Father    Cancer Paternal Grandmother        breast   Breast cancer Paternal Grandmother    Breast cancer Paternal Aunt     No Known Allergies  Review of Systems  Musculoskeletal:  Positive for back pain and neck pain.  All other systems reviewed and are negative.      Objective:   BP 118/80   Pulse (!) 101   Ht 5\' 5"  (1.651 m)   Wt 253 lb 12.8 oz (115.1 kg)    LMP  (LMP Unknown) Comment: pt has an IUD  SpO2 96%   BMI 42.23 kg/m   Vitals:   11/16/23 1309  BP: 118/80  Pulse: (!) 101  Height: 5\' 5"  (1.651 m)  Weight: 253 lb 12.8 oz (115.1 kg)  SpO2: 96%  BMI (Calculated): 42.23    Physical Exam Vitals and nursing note reviewed.  Constitutional:      Appearance: Normal appearance. She is normal weight.  HENT:     Head: Normocephalic.  Eyes:     Extraocular Movements: Extraocular movements intact.     Conjunctiva/sclera: Conjunctivae normal.     Pupils: Pupils are equal, round, and reactive to light.  Cardiovascular:     Rate and Rhythm: Normal rate.  Pulmonary:     Effort: Pulmonary effort is normal.  Musculoskeletal:     Cervical back: Spasms and tenderness present. Decreased range of motion.     Thoracic back: Spasms and tenderness present. Decreased range of motion.  Neurological:     General: No focal deficit present.     Mental Status: She is alert and oriented to person, place, and time. Mental status is at baseline.  Psychiatric:        Mood and Affect: Mood normal.        Behavior: Behavior normal.        Thought Content: Thought content normal.      No results found for any visits on 11/16/23.  No results found for this or any previous visit (from the past 2160 hour(s)).     Assessment & Plan:   Problem List Items Addressed This Visit   None Visit Diagnoses     Chronic upper back pain    -  Primary   Relevant Medications   celecoxib (CELEBREX) 100 MG capsule   Other Relevant Orders   MR Thoracic Spine Wo Contrast   Ambulatory referral to Neurosurgery   Chronic bilateral thoracic back pain       Relevant Medications   celecoxib (CELEBREX) 100 MG capsule   Other Relevant Orders   MR Thoracic Spine Wo Contrast   Ambulatory referral to Neurosurgery   Cervicalgia       Relevant Orders   MR Cervical Spine Wo Contrast   Ambulatory referral to Neurosurgery      Given that she has had 6 weeks of  conservative treatment, I would like to get an MRI of her Cervical and Thoracic spine.  I have put orders in for this.  Also referring her to Neurosurgery.   Finally sending RX for Celecoxib, will try this for her pain.   Return to be set based on results of MRI.   Total time spent: 20 minutes  WELDA SLIFER, FNP  11/16/2023   This document may have been prepared by [facility] Voice Recognition software and as such may  include unintentional dictation errors.

## 2023-11-27 ENCOUNTER — Other Ambulatory Visit: Payer: Self-pay | Admitting: Family

## 2023-12-07 NOTE — Telephone Encounter (Signed)
Discussed at follow up

## 2023-12-14 ENCOUNTER — Other Ambulatory Visit: Payer: Self-pay | Admitting: Family

## 2023-12-30 ENCOUNTER — Other Ambulatory Visit: Payer: Managed Care, Other (non HMO)

## 2023-12-30 ENCOUNTER — Ambulatory Visit: Payer: Managed Care, Other (non HMO) | Admitting: Family

## 2023-12-30 DIAGNOSIS — R319 Hematuria, unspecified: Secondary | ICD-10-CM

## 2023-12-30 DIAGNOSIS — N390 Urinary tract infection, site not specified: Secondary | ICD-10-CM | POA: Diagnosis not present

## 2023-12-30 LAB — POCT URINALYSIS DIPSTICK
Bilirubin, UA: NEGATIVE
Blood, UA: NEGATIVE
Glucose, UA: NEGATIVE
Ketones, UA: NEGATIVE
Leukocytes, UA: NEGATIVE
Nitrite, UA: NEGATIVE
Protein, UA: NEGATIVE
Spec Grav, UA: 1.01 (ref 1.010–1.025)
Urobilinogen, UA: 0.2 U/dL
pH, UA: 6 (ref 5.0–8.0)

## 2023-12-30 NOTE — Progress Notes (Signed)
   CHIEF COMPLAINT  UA/ only visit fot UTI     REASON FOR VISIT  Possible UTI, UA Visit Only      ASSESSMENT & PLAN Diagnoses and all orders for this visit:  Urinary tract infection with hematuria, site unspecified -     POCT Urinalysis Dipstick (16109)     Patient notified.  Total time spent: 5 minutes  CHARLESZETTA SCHOEFF, FNP 12/30/2023

## 2024-01-06 ENCOUNTER — Ambulatory Visit
Admission: RE | Admit: 2024-01-06 | Discharge: 2024-01-06 | Disposition: A | Discharge status: Home / Self Care | Payer: Managed Care, Other (non HMO) | Source: Ambulatory Visit | Attending: Family | Admitting: Family

## 2024-01-06 DIAGNOSIS — G8929 Other chronic pain: Secondary | ICD-10-CM

## 2024-01-06 DIAGNOSIS — M542 Cervicalgia: Secondary | ICD-10-CM

## 2024-01-11 ENCOUNTER — Other Ambulatory Visit: Payer: Self-pay | Admitting: Family

## 2024-01-20 ENCOUNTER — Telehealth: Payer: Self-pay | Admitting: Family

## 2024-01-20 NOTE — Telephone Encounter (Signed)
 Patient called in wanting something sent in for her back pain. States she will be seeing neurosurgery on 2/11. Please send to [facility].

## 2024-01-21 MED ORDER — TRAMADOL HCL 50 MG PO TABS: 50.0000 mg | ORAL_TABLET | Freq: Four times a day (QID) | ORAL | 0 refills | Status: DC | PRN

## 2024-01-22 NOTE — Progress Notes (Signed)
Referring Physician:  Miki Kins, FNP [address] [place],  [place] [address]  Primary Physician:  Miki Kins, FNP  History of Present Illness: 01/22/2024 [name] is a 33 y.o with a history of HTN, HLD, depression, anxiety, GERD, hysterectomy who is here today with a chief complaint of neck and upper back pain has been going on for about 6 months. She denies any inciting event. The pain starts at the base of her skull and radiates down into her shoulder blades and middle of her back around her bra line.  She describes it as stiff with it up into it sharp pain with movement. It is exacerbated by standing, and walking.  She attempted physical therapy at [facility] but this was not particularly helpful.  In addition of that she has tried Celebrex, tramadol, Flexeril and tizanidine.  She has not had any formal injections.  She states she also attempted weight loss but this has not made any meaningful difference to her degree of pain.  Conservative measures:  Physical therapy:  has participated in multiple visits at [facility] but it was not helpful  Multimodal medical therapy including regular antiinflammatories: Celebrex, Tramadol, Tizanidine Injections: no epidural steroid injections    Past Surgery: no spinal surgeries  Felix Pacini has no symptoms of cervical myelopathy.  The symptoms are causing a significant impact on the patient's life.   Review of Systems:  A 10 point review of systems is negative, except for the pertinent positives and negatives detailed in the HPI.  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   Constipation 05/06/2017   Depression with anxiety 02/04/2012   Note: Unchanged     GERD (gastroesophageal reflux disease)    Headache    Migraines/sinus   Hyperlipidemia    Hypertension    Sprain of ankle 11/09/2017   Vertigo 10/02/2016    Past Surgical History: Past  Surgical History:  Procedure Laterality Date   CHOLECYSTECTOMY N/A 04/22/2016   Procedure: LAPAROSCOPIC CHOLECYSTECTOMY WITH INTRAOPERATIVE CHOLANGIOGRAM;  Surgeon: Leafy Ro, MD;  Location: [facility] ORS;  Service: General;  Laterality: N/A;   ENDOSCOPIC RETROGRADE CHOLANGIOPANCREATOGRAPHY (ERCP) WITH PROPOFOL N/A 04/24/2016   Procedure: ENDOSCOPIC RETROGRADE CHOLANGIOPANCREATOGRAPHY (ERCP) WITH PROPOFOL;  Surgeon: Midge Minium, MD;  Location: [facility] ENDOSCOPY;  Service: Endoscopy;  Laterality: N/A;   ESOPHAGOGASTRODUODENOSCOPY (EGD) WITH PROPOFOL N/A 12/21/2015   Procedure: ESOPHAGOGASTRODUODENOSCOPY (EGD) WITH PROPOFOL;  Surgeon: Wallace Cullens, MD;  Location: [facility] ENDOSCOPY;  Service: Gastroenterology;  Laterality: N/A;   IMAGE GUIDED SINUS SURGERY N/A 12/26/2016   Procedure: IMAGE GUIDED SINUS SURGERY;  Surgeon: Linus Salmons, MD;  Location: [facility] SURGERY CNTR;  Service: ENT;  Laterality: N/A;   MAXILLARY ANTROSTOMY Left 12/26/2016   Procedure: MAXILLARY ANTROSTOMY;  Surgeon: Linus Salmons, MD;  Location: [facility] SURGERY [facility]TR;  Service: ENT;  Laterality: Left;   SEPTOPLASTY N/A 12/26/2016   Procedure: SEPTOPLASTY;  Surgeon: Linus Salmons, MD;  Location: [facility] SURGERY [facility];  Service: ENT;  Laterality: N/A;   TURBINATE REDUCTION Bilateral 12/26/2016   Procedure: Frederik Schmidt REDUCTION;  Surgeon: Linus Salmons, MD;  Location: [facility] SURGERY CNTR;  Service: ENT;  Laterality: Bilateral;    Allergies: Allergies as of 01/26/2024   (No Known Allergies)    Medications: Outpatient Encounter Medications as of 01/26/2024  Medication Sig   celecoxib (CELEBREX) 100 MG capsule TAKE 1 CAPSULE BY MOUTH TWICE A DAY   cetirizine (ZYRTEC ALLERGY) 10 MG tablet Take 1  tablet (10 mg total) by mouth daily.   diltiazem (CARDIZEM CD) 180 MG 24 hr capsule Take by mouth.   FLUoxetine HCl 60 MG TABS Take 60 mg by mouth daily.   hydrOXYzine (ATARAX/VISTARIL) 25 MG tablet Take 12.5-25 mg by mouth every 12 (twelve) hours as  needed for anxiety.   rosuvastatin (CRESTOR) 20 MG tablet Take 1 tablet (20 mg total) by mouth at bedtime.   spironolactone (ALDACTONE) 50 MG tablet Take 50 mg by mouth every morning.   tiZANidine (ZANAFLEX) 4 MG tablet TAKE 1 TABLET BY MOUTH THREE TIMES A DAY   traMADol (ULTRAM) 50 MG tablet Take 1 tablet (50 mg total) by mouth every 6 (six) hours as needed for severe pain (pain score 7-10) or moderate pain (pain score 4-6).   No facility-administered encounter medications on file as of 01/26/2024.    Social History: Social History   Tobacco Use   Smoking status: Never   Smokeless tobacco: Never  Substance Use Topics   Alcohol use: No   Drug use: No    Family Medical History: Family History  Problem Relation Age of Onset   Heart disease Mother    Diabetes Father    Hypertension Father    Cancer Paternal Grandmother        breast   Breast cancer Paternal Grandmother    Breast cancer Paternal Aunt     Physical Examination: Today's Vitals   01/26/24 1029  BP: 124/74  Weight: 109.8 kg  Height: 5\' 5"  (1.651 m)  PainSc: 5   PainLoc: Back   Body mass index is 40.27 kg/m.   General: Patient is well developed, well nourished, calm, collected, and in no apparent distress. Attention to examination is appropriate.  Psychiatric: Patient is non-anxious.  Head:  Pupils equal, round, and reactive to light.  ENT:  Oral mucosa appears well hydrated.  Neck:   Supple.   Respiratory: Patient is breathing without any difficulty.  Extremities: No edema.  Vascular: Palpable dorsal pedal pulses.  Skin:   On exposed skin, there are no abnormal skin lesions.  NEUROLOGICAL:     Awake, alert, oriented to person, place, and time.  Speech is clear and fluent. Fund of knowledge is appropriate.   Cranial Nerves: Pupils equal round and reactive to light.  Facial tone is symmetric.  Facial sensation is symmetric.  ROM of spine: full.  Palpation of spine: non tender.     Strength: Side Biceps Triceps Deltoid Interossei Grip Wrist Ext. Wrist Flex.  R 5 5 5 5 5 5 5   L 5 5 5 5 5 5 5    Side Iliopsoas Quads Hamstring PF DF EHL  R 5 5 5 5 5 5   L 5 5 5 5 5 5    Reflexes are 2+ and symmetric at the biceps, triceps, brachioradialis, patella and achilles.   Hoffman's is absent.  Clonus is not present.  Toes are down-going.  Bilateral upper and lower extremity sensation is intact to light touch.    Gait is normal.    MSK: neck and interscapular pain seem to be exacerbated by upper extremity exam.  Medical Decision Making  Imaging:  MRI C spine 01/06/24 IMPRESSION: MRI CERVICAL SPINE:   1. Right paracentral to foraminal disc protrusion with uncovertebral spurring at C4-5 with resultant mild to moderate right C5 foraminal stenosis. 2. Additional mild noncompressive disc bulging at C3-4 through C6-7 without significant stenosis or impingement. 3. Trace facet mediated anterolisthesis of C7 on T1 without stenosis.  MRI T spine 01/06/24 MRI THORACIC SPINE:   1. Dextroscoliosis with associated mild multilevel facet hypertrophy throughout the thoracic spine as above. Resultant mild left foraminal narrowing at T7-8. No other significant foraminal encroachment. 2. No significant disc pathology or spinal stenosis within the thoracic spine. 3. 1.6 cm nodule arising from the lateral limb of the right adrenal gland. Recommend follow-up adrenal washout CT in 1 year. If stable for ? 1 year, no further follow-up imaging. JACR 2017 Aug; 14(8):1038-44, JCAT 2016 Mar-Apr; 40(2):194-200, Urol J 2006 Spring; 3(2):71-4.     Electronically Signed   By: Rise Mu M.D.   On: 01/07/2024 02:23  I have personally reviewed the images and agree with the above interpretation.  Assessment and Plan: Ms. Puthoff is a pleasant 33 y.o. female with 6 months of neck and middle back pain without radiculopathy.  While her cervical MRI does show foraminal narrowing on  the right at C5, she is not localize her pain to just this area.  It could however be exacerbating her intrascapular pain.  I recommended starting with more conservative management and have made some changes to her medications.  I recommended Robaxin 4 times a day as needed as well as naproxen and have given her prescriptions for this.  In addition to this I would like for her to see physical therapy for dry needling and myofascial release.  I referred her to [facility] for this.  I will see her back via telephone visit in 6 to 8 weeks to discuss her progress.  Should she fail to improve, we will consider referral to PM&R to consider ESI's or trigger point ejections.  She was encouraged to call our office in the interim should she have any questions or concerns.  She expressed understanding and was in agreement with this plan.  Thank you for involving me in the care of this patient.   I spent a total of 43 minutes in both face-to-face and non-face-to-face activities for this visit on the date of this encounter including review of outside records, review of symptoms, discussion of differential diagnosis, review of imaging, physical exam, order placement, and documentation.   [facility] Dept. of Neurosurgery

## 2024-01-26 ENCOUNTER — Ambulatory Visit: Payer: Managed Care, Other (non HMO) | Admitting: Neurosurgery

## 2024-01-26 ENCOUNTER — Other Ambulatory Visit: Payer: Self-pay | Admitting: Family

## 2024-01-26 ENCOUNTER — Other Ambulatory Visit: Payer: Self-pay | Admitting: Cardiology

## 2024-01-26 VITALS — BP 124/74 | Ht 65.0 in | Wt 242.0 lb

## 2024-01-26 DIAGNOSIS — F419 Anxiety disorder, unspecified: Secondary | ICD-10-CM

## 2024-01-26 DIAGNOSIS — M542 Cervicalgia: Secondary | ICD-10-CM | POA: Diagnosis not present

## 2024-01-26 DIAGNOSIS — M546 Pain in thoracic spine: Secondary | ICD-10-CM

## 2024-01-26 MED ORDER — NAPROXEN 500 MG PO TABS: 500.0000 mg | ORAL_TABLET | Freq: Two times a day (BID) | ORAL | 2 refills | Status: DC

## 2024-01-26 MED ORDER — METHOCARBAMOL 500 MG PO TABS: 500.0000 mg | ORAL_TABLET | Freq: Four times a day (QID) | ORAL | 0 refills | Status: DC

## 2024-02-29 ENCOUNTER — Other Ambulatory Visit: Payer: Self-pay

## 2024-02-29 ENCOUNTER — Emergency Department
Admission: EM | Admit: 2024-02-29 | Discharge: 2024-02-29 | Disposition: A | Discharge status: Home / Self Care | Payer: Self-pay | Attending: Emergency Medicine | Admitting: Emergency Medicine

## 2024-02-29 ENCOUNTER — Ambulatory Visit (INDEPENDENT_AMBULATORY_CARE_PROVIDER_SITE_OTHER): Admitting: Family

## 2024-02-29 ENCOUNTER — Emergency Department: Payer: Self-pay

## 2024-02-29 DIAGNOSIS — J069 Acute upper respiratory infection, unspecified: Secondary | ICD-10-CM

## 2024-02-29 DIAGNOSIS — R030 Elevated blood-pressure reading, without diagnosis of hypertension: Secondary | ICD-10-CM | POA: Insufficient documentation

## 2024-02-29 DIAGNOSIS — I10 Essential (primary) hypertension: Secondary | ICD-10-CM | POA: Insufficient documentation

## 2024-02-29 LAB — POCT XPERT XPRESS SARS COVID-2/FLU/RSV
FLU A: NEGATIVE
FLU B: NEGATIVE
RSV RNA, PCR: NEGATIVE
SARS Coronavirus 2: NEGATIVE

## 2024-02-29 MED ORDER — KETOROLAC TROMETHAMINE 30 MG/ML IJ SOLN
30.0000 mg | Freq: Once | INTRAMUSCULAR | Status: AC
  Administered 2024-02-29: 30 mg via INTRAMUSCULAR
  Filled 2024-02-29: qty 1

## 2024-02-29 MED ORDER — PAXLOVID (300/100) 20 X 150 MG & 10 X 100MG PO TBPK: ORAL_TABLET | ORAL | 0 refills | Status: DC

## 2024-02-29 NOTE — ED Provider Triage Note (Addendum)
 Emergency Medicine Provider Triage Evaluation Note  Casey Hansen , a 33 y.o. female  was evaluated in triage.  Pt complains of cough, fever and chest tightness. Patient was tested for flu, covid and rsv today which was negative. Symptoms began on Saturday morning.   Review of Systems  Positive: See above Negative:   Physical Exam  LMP  (LMP Unknown) Comment: pt has an IUD Gen:   Awake, no distress   Resp:  Normal effort  MSK:   Moves extremities without difficulty  Other:    Medical Decision Making  Medically screening exam initiated at 6:06 PM.  Appropriate orders placed.  Felix Pacini was informed that the remainder of the evaluation will be completed by another provider, this initial triage assessment does not replace that evaluation, and the importance of remaining in the ED until their evaluation is complete.     Cameron Ali, PA-C 02/29/24 1807    Cameron Ali, PA-C 02/29/24 1808

## 2024-02-29 NOTE — Discharge Instructions (Signed)
 You were seen in the emergency department today for a cough.   Your chest x-ray does not show pneumonia.  Your physical exam findings are reassuring.  Please consider picking up Alka-Seltzer severe cold and cough from over-the-counter at your local pharmacy.  A viral cough may last up to 2-3 weeks.   Take tylenol or ibuprofen for pain or fever as directed.   Stay hydrated by drinking plenty of fluids to thin mucus. Get adequate amount of sleep and avoid overexertion. Consider a humidifier at night. Warm teas and a spoonful of honey may help reduce cough frequency. Follow up with your primary care provider as needed.   Use throat lozenges or Chloraseptic spray.  Gargle with warm salt water several times daily

## 2024-02-29 NOTE — ED Provider Notes (Signed)
 [facility] Emergency Department Provider Note     Event Date/Time   First MD Initiated Contact with Patient 02/29/24 2006     (approximate)   History   Cough   HPI  Casey Hansen is a 33 y.o. female with a history of GERD, HTN, and HLD presents to the ED for evaluation of productive cough x 1 day.  Associated symptoms includes fever and a headache.  She was seen earlier today by her primary care provider and tested negative for RSV, COVID and influenza.  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  She comes to the ED to rule out pneumonia.     Physical Exam   Triage Vital Signs: ED Triage Vitals  Encounter Vitals Group     BP 02/29/24 1806 (!) 134/104     Systolic BP Percentile --      Diastolic BP Percentile --      Pulse Rate 02/29/24 1806 89     Resp 02/29/24 1806 18     Temp 02/29/24 1806 98.4 F (36.9 C)     Temp src --      SpO2 02/29/24 1806 97 %     Weight 02/29/24 1807 232 lb (105.2 kg)     Height 02/29/24 1807 5\' 5"  (1.651 m)     Head Circumference --      Peak Flow --      Pain Score 02/29/24 1807 5     Pain Loc --      Pain Education --      Exclude from Growth Chart --     Most recent vital signs: Vitals:   02/29/24 1806  BP: (!) 134/104  Pulse: 89  Resp: 18  Temp: 98.4 F (36.9 C)  SpO2: 97%    General: Alert and oriented. INAD.     Head:  NCAT.  Ears:  EACs patent. Tympanic membranes clear bilaterally. No bulging, erythema or discharge.  Nose:   Nasal congestion. Throat: Oropharynx clear. No erythema or exudates. Tonsils not enlarged. Uvula is midline.  CV:  Good peripheral perfusion. RRR.  RESP:  Normal effort. LCTAB. No retractions.   ED Results / Procedures / Treatments   Labs (all labs ordered are listed, but only abnormal results are displayed) Labs Reviewed - No data to display  RADIOLOGY  I personally viewed and evaluated these images as part of my medical decision making, as well as reviewing  the written report by the radiologist.  ED Provider Interpretation: No focal consolidation on chest x-ray.  DG Chest 2 View Result Date: 02/29/2024 CLINICAL DATA:  Fever and cough.  Rule out pneumonia. EXAM: CHEST - 2 VIEW COMPARISON:  Chest radiograph dated 06/11/2018. FINDINGS: The heart size and mediastinal contours are within normal limits. Both lungs are clear. The visualized skeletal structures are unremarkable. IMPRESSION: No active cardiopulmonary disease. Electronically Signed   By: Elgie Collard M.D.   On: 02/29/2024 18:55    PROCEDURES:  Critical Care performed: No  Procedures   MEDICATIONS ORDERED IN ED: Medications  ketorolac (TORADOL) 30 MG/ML injection 30 mg (has no administration in time range)     IMPRESSION / MDM / ASSESSMENT AND PLAN / ED COURSE  I reviewed the triage vital signs and the nursing notes.                               33 y.o. female presents to the  emergency department for evaluation and treatment of acute cough. See HPI for further details.   Differential diagnosis includes, but is not limited to PNA, viral URI  Patient's presentation is most consistent with acute complicated illness / injury requiring diagnostic workup.  Patient is alert and oriented.  She has an elevated BP of 134/104.  On physical exam patient is nontoxic-appearing.  Lung exam is normal.  Patient is speaking in complete sentences but does sound very nasally congested.  Chest x-ray is reassuring for pneumonia.  Discussed symptomatic treatment with patient for at home care and to get plenty of rest.  Patient verbalized understanding.  I do believe patient is a stable condition for discharge home and outpatient management.  ED return precautions discussed.   FINAL CLINICAL IMPRESSION(S) / ED DIAGNOSES   Final diagnoses:  Viral URI with cough   Rx / DC Orders   ED Discharge Orders     None        Note:  This document was prepared using Dragon voice recognition  software and may include unintentional dictation errors.    Romeo Apple, Caralina Nop A, PA-C 02/29/24 2134    Sharman Cheek, MD 02/29/24 2222

## 2024-02-29 NOTE — ED Triage Notes (Signed)
 Pt to ED for cough, back pain, fever for a few days. States thinks has PNA. Tested neg for covid/flu/rsv today.  Works in day care

## 2024-02-29 NOTE — Progress Notes (Signed)
   Subjective   CHIEF COMPLAINT  COVID Testing    REASON FOR VISIT  COVID testing for URI symptoms.        Objective   Results for orders placed or performed in visit on 02/29/24  POCT XPERT XPRESS SARS COVID-2/FLU/RSV  Result Value Ref Range   SARS Coronavirus 2 Negative    FLU A Negative    FLU B Negative    RSV RNA, PCR Negative     Assessment & Plan  1. Acute upper respiratory infection (Primary) - POCT XPERT XPRESS SARS COVID-2/FLU/RSV   Total time spent: 5 minutes  BREEANNA GALGANO, FNP 02/29/2024

## 2024-03-01 ENCOUNTER — Ambulatory Visit (INDEPENDENT_AMBULATORY_CARE_PROVIDER_SITE_OTHER): Payer: Managed Care, Other (non HMO) | Admitting: Neurosurgery

## 2024-03-01 DIAGNOSIS — Z91199 Patient's noncompliance with other medical treatment and regimen due to unspecified reason: Secondary | ICD-10-CM

## 2024-03-01 NOTE — Progress Notes (Signed)
   Two attempts made to connect with patient via telephone to complete visit. Mychart message sent encouraging patient to call to reschedule.  Man[facility] Charity PA-C Neurosurgery

## 2024-03-04 ENCOUNTER — Emergency Department
Admission: EM | Admit: 2024-03-04 | Discharge: 2024-03-04 | Disposition: A | Discharge status: Home / Self Care | Payer: Self-pay | Attending: Emergency Medicine | Admitting: Emergency Medicine

## 2024-03-04 ENCOUNTER — Other Ambulatory Visit: Payer: Self-pay

## 2024-03-04 DIAGNOSIS — R519 Headache, unspecified: Secondary | ICD-10-CM | POA: Insufficient documentation

## 2024-03-04 DIAGNOSIS — R42 Dizziness and giddiness: Secondary | ICD-10-CM | POA: Insufficient documentation

## 2024-03-04 LAB — BASIC METABOLIC PANEL
Anion gap: 10 (ref 5–15)
BUN: 12 mg/dL (ref 6–20)
CO2: 23 mmol/L (ref 22–32)
Calcium: 9.7 mg/dL (ref 8.9–10.3)
Chloride: 103 mmol/L (ref 98–111)
Creatinine, Ser: 0.8 mg/dL (ref 0.44–1.00)
GFR, Estimated: >60 mL/min (ref >60)
Glucose, Bld: 89 mg/dL (ref 70–99)
Potassium: 4.1 mmol/L (ref 3.5–5.1)
Sodium: 136 mmol/L (ref 135–145)

## 2024-03-04 LAB — CBC
HCT: 45.3 % (ref 36.0–46.0)
Hemoglobin: 15.3 g/dL — ABNORMAL HIGH (ref 12.0–15.0)
MCH: 30.3 pg (ref 26.0–34.0)
MCHC: 33.8 g/dL (ref 30.0–36.0)
MCV: 89.7 fL (ref 80.0–100.0)
Platelets: 350 10*3/uL (ref 150–400)
RBC: 5.05 MIL/uL (ref 3.87–5.11)
RDW: 12 % (ref 11.5–15.5)
WBC: 9.2 10*3/uL (ref 4.0–10.5)
nRBC: 0 % (ref 0.0–0.2)

## 2024-03-04 MED ORDER — PROCHLORPERAZINE MALEATE 10 MG PO TABS: 10.0000 mg | ORAL_TABLET | Freq: Three times a day (TID) | ORAL | 0 refills | Status: DC | PRN

## 2024-03-04 MED ORDER — PROCHLORPERAZINE EDISYLATE 10 MG/2ML IJ SOLN
10.0000 mg | Freq: Once | INTRAMUSCULAR | Status: AC
  Administered 2024-03-04: 10 mg via INTRAVENOUS
  Filled 2024-03-04: qty 2

## 2024-03-04 MED ORDER — MECLIZINE HCL 25 MG PO TABS
25.0000 mg | ORAL_TABLET | Freq: Once | ORAL | Status: AC
  Administered 2024-03-04: 25 mg via ORAL
  Filled 2024-03-04: qty 1

## 2024-03-04 MED ORDER — SODIUM CHLORIDE 0.9 % IV BOLUS
500.0000 mL | Freq: Once | INTRAVENOUS | Status: AC
  Administered 2024-03-04: 500 mL via INTRAVENOUS

## 2024-03-04 MED ORDER — MECLIZINE HCL 25 MG PO TABS: 25.0000 mg | ORAL_TABLET | Freq: Three times a day (TID) | ORAL | 0 refills | Status: DC | PRN

## 2024-03-04 NOTE — ED Notes (Signed)
 RN back to bedside to reassess pt. Pt states she feels less anxious and feels calm now but is still requesting to leave. RN informed pt that MD will come speak with her before she leaves. Pt agreeable at this time. MD made aware that pt is still wanting to leave.

## 2024-03-04 NOTE — ED Triage Notes (Signed)
 Pt to ED for intermittent dizziness after tanning in tanning bed and standing up. Reports emesis x1.

## 2024-03-04 NOTE — ED Provider Notes (Signed)
 [facility] Provider Note    Event Date/Time   First MD Initiated Contact with Patient 03/04/24 1524     (approximate)   History   Dizziness   HPI  Casey Hansen is a 33 y.o. female who presents to the emergency department today because of concerns for dizziness.  She states that her symptoms started a couple of days ago.  She was in the kitchen and she turned her head and all of a sudden felt very dizzy.  This has continued.  She has noticed that at times when she turns her head or sits up.  Patient went to work today but started having the symptoms again.  She had to leave work due to the dizziness.  She then went to a gym and use the tanning bed.  After using the tanning bed she became very dizzy again.  She thinks she might of passed out.  The dizziness is making it hard for her to walk.  She denies similar symptoms in the past.  Does state that she had an upper respiratory illness about a week ago.   Physical Exam   Triage Vital Signs: ED Triage Vitals  Encounter Vitals Group     BP 03/04/24 1428 120/87     Systolic BP Percentile --      Diastolic BP Percentile --      Pulse Rate 03/04/24 1428 85     Resp 03/04/24 1426 18     Temp 03/04/24 1426 98.5 F (36.9 C)     Temp src --      SpO2 03/04/24 1428 97 %     Weight 03/04/24 1426 233 lb (105.7 kg)     Height 03/04/24 1426 5\' 5"  (1.651 m)     Head Circumference --      Peak Flow --      Pain Score 03/04/24 1426 0     Pain Loc --      Pain Education --      Exclude from Growth Chart --     Most recent vital signs: Vitals:   03/04/24 1426 03/04/24 1428  BP:  120/87  Pulse:  85  Resp: 18   Temp: 98.5 F (36.9 C)   SpO2:  97%   General: Awake, alert, oriented. CV:  Good peripheral perfusion. Regular rate and rhythm. Resp:  Normal effort. Lungs clear. Abd:  No distention.  Other:  EOMI. PERRL. Face symmetric. Strength 5/5 in upper and lower extremities. Sensation grossly intact.     ED Results / Procedures / Treatments   Labs (all labs ordered are listed, but only abnormal results are displayed) Labs Reviewed  CBC - Abnormal; Notable for the following components:      Result Value   Hemoglobin 15.3 (*)    All other components within normal limits  BASIC METABOLIC PANEL     EKG  I, Phineas Semen, attending physician, personally viewed and interpreted this EKG  EKG Time: 1429 Rate: 95 Rhythm: normal sinus rhythm Axis: rightward axis  Intervals: qtc 469 QRS: narrow ST changes: no st elevation Impression: abnormal ekg    RADIOLOGY None   PROCEDURES:  Critical Care performed: No    MEDICATIONS ORDERED IN ED: Medications - No data to display   IMPRESSION / MDM / ASSESSMENT AND PLAN / ED COURSE  I reviewed the triage vital signs and the nursing notes.  Differential diagnosis includes, but is not limited to, vertigo, cva, anemia, electrolyte abnormality, dehydration  Patient's presentation is most consistent with acute presentation with potential threat to life or bodily function.   Patient presented to the emergency department today because of concerns for dizziness.  Clinical history is concerning for possible vertigo.  Patient was given amoxicillin however denied any significant relief.  She then started developing a headache.  Because of this MRI was ordered.  She was also given medication to help with her headache.  Prior to MRI being performed she stated she got good relief from the medication.  She states she no longer felt like she would want to wait for the MRI.  Given that the patient's symptoms had improved significantly I think this is reasonable.  Will plan on discharging.   FINAL CLINICAL IMPRESSION(S) / ED DIAGNOSES   Final diagnoses:  Dizziness  Nonintractable headache, unspecified chronicity pattern, unspecified headache type    Note:  This document was prepared using Dragon voice  recognition software and may include unintentional dictation errors.    Phineas Semen, MD 03/04/24 [phone]

## 2024-03-04 NOTE — ED Notes (Signed)
 RN to bedside to answer call light. Pt stating she is feeling anxious and agitated and is wanting to leave. MD made aware at this time.

## 2024-03-14 ENCOUNTER — Ambulatory Visit: Admitting: Family

## 2024-03-14 ENCOUNTER — Encounter: Payer: Self-pay | Admitting: Family

## 2024-03-14 VITALS — BP 114/82 | HR 94 | Ht 65.0 in | Wt 236.2 lb

## 2024-03-14 DIAGNOSIS — Z0130 Encounter for examination of blood pressure without abnormal findings: Secondary | ICD-10-CM

## 2024-03-14 DIAGNOSIS — Z6839 Body mass index (BMI) 39.0-39.9, adult: Secondary | ICD-10-CM

## 2024-03-14 DIAGNOSIS — F419 Anxiety disorder, unspecified: Secondary | ICD-10-CM

## 2024-03-14 DIAGNOSIS — E66812 Obesity, class 2: Secondary | ICD-10-CM | POA: Diagnosis not present

## 2024-03-14 MED ORDER — PREDNISONE 20 MG PO TABS
40.0000 mg | ORAL_TABLET | Freq: Every day | ORAL | 0 refills | Status: DC
Start: 2024-03-14 — End: 2024-04-13

## 2024-03-14 MED ORDER — WEGOVY 2.4 MG/0.75ML ~~LOC~~ SOAJ: 2.4000 mg | SUBCUTANEOUS | 2 refills | Status: DC

## 2024-03-14 MED ORDER — FLUTICASONE PROPIONATE 50 MCG/ACT NA SUSP
2.0000 | Freq: Every day | NASAL | 3 refills | Status: DC
Start: 2024-03-14 — End: 2024-04-13

## 2024-03-14 MED ORDER — FEXOFENADINE HCL 180 MG PO TABS: 180.0000 mg | ORAL_TABLET | Freq: Every day | ORAL | 1 refills | Status: DC

## 2024-03-14 NOTE — Progress Notes (Signed)
 Established Patient Office Visit  Subjective:  Patient ID: Casey Hansen, female    DOB: 1991-06-30  Age: 33 y.o. MRN: [id number]  Chief Complaint  Patient presents with   Follow-up    Patient is here today with a few concerns: 1) she asks if we can send her a prescription for wegovy. She has been getting it elsewhere, and has lost significant weight, asks if she can get an RX for it.  2) She does not think that her antidepressants are working any more.  She is feeling very down and says that she is also having more anxiety. ' 3) Had an episode of passing out a few weeks ago, but she thinks this might have been because she was dehydrated or something similar. She went to the ED at that time.  No other concerns today.   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   Constipation 05/06/2017   Depression with anxiety 02/04/2012   Note: Unchanged     GERD (gastroesophageal reflux disease)    Headache    Migraines/sinus   Hyperlipidemia    Hypertension    Sprain of ankle 11/09/2017   Vertigo 10/02/2016    Past Surgical History:  Procedure Laterality Date   CHOLECYSTECTOMY N/A 04/22/2016   Procedure: LAPAROSCOPIC CHOLECYSTECTOMY WITH INTRAOPERATIVE CHOLANGIOGRAM;  Surgeon: Leafy Ro, MD;  Location: [facility];  Service: General;  Laterality: N/A;   ENDOSCOPIC RETROGRADE CHOLANGIOPANCREATOGRAPHY (ERCP) WITH PROPOFOL N/A 04/24/2016   Procedure: ENDOSCOPIC RETROGRADE CHOLANGIOPANCREATOGRAPHY (ERCP) WITH PROPOFOL;  Surgeon: Midge Minium, MD;  Location: [facility] ENDOSCOPY;  Service: Endoscopy;  Laterality: N/A;   ESOPHAGOGASTRODUODENOSCOPY (EGD) WITH PROPOFOL N/A 12/21/2015   Procedure: ESOPHAGOGASTRODUODENOSCOPY (EGD) WITH PROPOFOL;  Surgeon: Wallace Cullens, MD;  Location: [facility] ENDOSCOPY;  Service: Gastroenterology;  Laterality: N/A;   IMAGE GUIDED SINUS SURGERY N/A 12/26/2016   Procedure: IMAGE GUIDED SINUS SURGERY;  Surgeon: Linus Salmons, MD;   Location: [facility];  Service: ENT;  Laterality: N/A;   MAXILLARY ANTROSTOMY Left 12/26/2016   Procedure: MAXILLARY ANTROSTOMY;  Surgeon: Linus Salmons, MD;  Location: [facility];  Service: ENT;  Laterality: Left;   SEPTOPLASTY N/A 12/26/2016   Procedure: SEPTOPLASTY;  Surgeon: Linus Salmons, MD;  Location: [facility];  Service: ENT;  Laterality: N/A;   TURBINATE REDUCTION Bilateral 12/26/2016   Procedure: Frederik Schmidt REDUCTION;  Surgeon: Linus Salmons, MD;  Location: [facility];  Service: ENT;  Laterality: Bilateral;    Social History   Socioeconomic History   Marital status: Married    Spouse name: Not on file   Number of children: Not on file   Years of education: Not on file   Highest education level: Not on file  Occupational History   Not on file  Tobacco Use   Smoking status: Never   Smokeless tobacco: Never  Substance and Sexual Activity   Alcohol use: No   Drug use: No   Sexual activity: Not on file  Other Topics Concern   Not on file  Social History Narrative   Not on file   Social Drivers of Health   Financial Resource Strain: Not on file  Food Insecurity: Not on file  Transportation Needs: Not on file  Physical Activity: Not on file  Stress: Not on file  Social Connections: Not on file  Intimate Partner Violence: Not on file    Family History  Problem Relation Age of Onset   Heart disease Mother  Diabetes Father    Hypertension Father    Cancer Paternal Grandmother        breast   Breast cancer Paternal Grandmother    Breast cancer Paternal Aunt     No Known Allergies  Review of Systems  Psychiatric/Behavioral:  Positive for depression. The patient has insomnia.   All other systems reviewed and are negative.      Objective:   BP 114/82   Pulse 94   Ht 5\' 5"  (1.651 m)   Wt 236 lb 3.2 oz (107.1 kg)   LMP  (LMP Unknown) Comment: pt has an IUD  SpO2 98%   BMI 39.31 kg/m   Vitals:   03/14/24 1415   BP: 114/82  Pulse: 94  Height: 5\' 5"  (1.651 m)  Weight: 236 lb 3.2 oz (107.1 kg)  SpO2: 98%  BMI (Calculated): 39.31    Physical Exam Vitals and nursing note reviewed.  Constitutional:      Appearance: Normal appearance. She is normal weight.  HENT:     Head: Normocephalic.  Eyes:     Extraocular Movements: Extraocular movements intact.     Conjunctiva/sclera: Conjunctivae normal.     Pupils: Pupils are equal, round, and reactive to light.  Cardiovascular:     Rate and Rhythm: Normal rate.  Pulmonary:     Effort: Pulmonary effort is normal.  Neurological:     General: No focal deficit present.     Mental Status: She is alert and oriented to person, place, and time. Mental status is at baseline.  Psychiatric:        Attention and Perception: Attention and perception normal.        Mood and Affect: Mood is anxious.        Behavior: Behavior normal. Behavior is cooperative.        Thought Content: Thought content normal.        Cognition and Memory: Cognition and memory normal.        Judgment: Judgment normal.      No results found for any visits on 03/14/24.  Recent Results (from the past 2160 hours)  POCT Urinalysis Dipstick (40981)     Status: None   Collection Time: 12/30/23  3:26 PM  Result Value Ref Range   Color, UA Clear    Clarity, UA Clear    Glucose, UA Negative Negative   Bilirubin, UA neg    Ketones, UA neg    Spec Grav, UA 1.010 1.010 - 1.025   Blood, UA neg    pH, UA 6.0 5.0 - 8.0   Protein, UA Negative Negative   Urobilinogen, UA 0.2 0.2 or 1.0 E.U./dL   Nitrite, UA neg    Leukocytes, UA Negative Negative   Appearance clear    Odor no   POCT XPERT XPRESS SARS COVID-2/FLU/RSV     Status: None   Collection Time: 02/29/24  1:44 PM  Result Value Ref Range   SARS Coronavirus 2 Negative    FLU A Negative    FLU B Negative    RSV RNA, PCR Negative   CBC     Status: Abnormal   Collection Time: 03/04/24  2:29 PM  Result Value Ref Range   WBC  9.2 4.0 - 10.5 K/uL   RBC 5.05 3.87 - 5.11 MIL/uL   Hemoglobin 15.3 (H) 12.0 - 15.0 g/dL   HCT 19.1 47.8 - 29.5 %   MCV 89.7 80.0 - 100.0 fL   MCH 30.3 26.0 - 34.0 pg  MCHC 33.8 30.0 - 36.0 g/dL   RDW 95.2 84.1 - 32.4 %   Platelets 350 150 - 400 K/uL   nRBC 0.0 0.0 - 0.2 %    Comment: Performed at [facility], [address] [place], [place] [address]  Basic metabolic panel     Status: None   Collection Time: 03/04/24  2:29 PM  Result Value Ref Range   Sodium 136 135 - 145 mmol/L   Potassium 4.1 3.5 - 5.1 mmol/L   Chloride 103 98 - 111 mmol/L   CO2 23 22 - 32 mmol/L   Glucose, Bld 89 70 - 99 mg/dL    Comment: Glucose reference range applies only to samples taken after fasting for at least 8 hours.   BUN 12 6 - 20 mg/dL   Creatinine, Ser 7.25 0.44 - 1.00 mg/dL   Calcium 9.7 8.9 - 36.6 mg/dL   GFR, Estimated >44 >03 mL/min    Comment: (NOTE) Calculated using the CKD-EPI Creatinine Equation (2021)    Anion gap 10 5 - 15    Comment: Performed at [facility], [address] [place], [place] [address]       Assessment & Plan:   Problem List Items Addressed This Visit       Other   Anxiety - Primary   Adding Vraylar to current medications.       Obesity (Chronic)   Sending RX for [facility] for patient.  Will follow up to be sure this is effective for her.  Reassess at follow up.      Relevant Medications   Semaglutide-Weight Management (WEGOVY) 2.4 MG/0.75ML SOAJ    Return in about 1 month (around 04/13/2024).   Total time spent: 20 minutes  DALEYSSA LOISELLE, FNP  03/14/2024   This document may have been prepared by [facility] Voice Recognition software and as such may include unintentional dictation errors.

## 2024-03-20 ENCOUNTER — Encounter: Payer: Self-pay | Admitting: Family

## 2024-03-20 NOTE — Assessment & Plan Note (Signed)
 Adding Vraylar to current medications.

## 2024-03-20 NOTE — Assessment & Plan Note (Signed)
 Sending RX for [employer] for patient.  Will follow up to be sure this is effective for her.  Reassess at follow up.

## 2024-03-28 ENCOUNTER — Ambulatory Visit: Admitting: Cardiology

## 2024-03-28 ENCOUNTER — Encounter: Payer: Self-pay | Admitting: Cardiology

## 2024-03-28 VITALS — BP 122/78 | HR 91 | Ht 65.0 in | Wt 230.0 lb

## 2024-03-28 DIAGNOSIS — B029 Zoster without complications: Secondary | ICD-10-CM | POA: Insufficient documentation

## 2024-03-28 DIAGNOSIS — Z0130 Encounter for examination of blood pressure without abnormal findings: Secondary | ICD-10-CM

## 2024-03-28 MED ORDER — GABAPENTIN 300 MG PO CAPS: ORAL_CAPSULE | ORAL | 2 refills | Status: DC

## 2024-03-28 MED ORDER — VALACYCLOVIR HCL 1 G PO TABS: 2000.0000 mg | ORAL_TABLET | Freq: Three times a day (TID) | ORAL | 0 refills | Status: AC

## 2024-03-28 NOTE — Progress Notes (Signed)
 Established Patient Office Visit  Subjective:  Patient ID: Casey Hansen, female    DOB: 12/15/1991  Age: 33 y.o. MRN: [id number]  Chief Complaint  Patient presents with   Acute Visit    Spot on R Side of neck, almost feels like a burn but nothing there    Patient in office for an acute visit, complaining of painful spot on right side of neck. Patient complaining of pain/burning on touch right side of neck, near the collar of her shirt. No rash or burn noted on exam. Patient has experienced shingles in the past, on the left side along her bra line. Patient likely has shingles. Will send in Valtrex and gabapentin for pain. Keep appointment with regular provider on 04/13/24.  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    No other concerns at this time.   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   Constipation 05/06/2017   Depression with anxiety 02/04/2012   Note: Unchanged     GERD (gastroesophageal reflux disease)    Headache    Migraines/sinus   Hyperlipidemia    Hypertension    Sprain of ankle 11/09/2017   Vertigo 10/02/2016    Past Surgical History:  Procedure Laterality Date   CHOLECYSTECTOMY N/A 04/22/2016   Procedure: LAPAROSCOPIC CHOLECYSTECTOMY WITH INTRAOPERATIVE CHOLANGIOGRAM;  Surgeon: Leafy Ro, MD;  Location: [facility];  Service: General;  Laterality: N/A;   ENDOSCOPIC RETROGRADE CHOLANGIOPANCREATOGRAPHY (ERCP) WITH PROPOFOL N/A 04/24/2016   Procedure: ENDOSCOPIC RETROGRADE CHOLANGIOPANCREATOGRAPHY (ERCP) WITH PROPOFOL;  Surgeon: Midge Minium, MD;  Location: [facility] ENDOSCOPY;  Service: Endoscopy;  Laterality: N/A;   ESOPHAGOGASTRODUODENOSCOPY (EGD) WITH PROPOFOL N/A 12/21/2015   Procedure: ESOPHAGOGASTRODUODENOSCOPY (EGD) WITH PROPOFOL;  Surgeon: Wallace Cullens, MD;  Location: [facility] ENDOSCOPY;  Service: Gastroenterology;  Laterality: N/A;   IMAGE GUIDED SINUS SURGERY N/A 12/26/2016    Procedure: IMAGE GUIDED SINUS SURGERY;  Surgeon: Linus Salmons, MD;  Location: [facility];  Service: ENT;  Laterality: N/A;   MAXILLARY ANTROSTOMY Left 12/26/2016   Procedure: MAXILLARY ANTROSTOMY;  Surgeon: Linus Salmons, MD;  Location: [facility];  Service: ENT;  Laterality: Left;   SEPTOPLASTY N/A 12/26/2016   Procedure: SEPTOPLASTY;  Surgeon: Linus Salmons, MD;  Location: [facility];  Service: ENT;  Laterality: N/A;   TURBINATE REDUCTION Bilateral 12/26/2016   Procedure: Frederik Schmidt REDUCTION;  Surgeon: Linus Salmons, MD;  Location: [facility];  Service: ENT;  Laterality: Bilateral;    Social History   Socioeconomic History   Marital status: Married    Spouse name: Not on file   Number of children: Not on file   Years of education: Not on file   Highest education level: Not on file  Occupational History   Not on file  Tobacco Use   Smoking status: Never   Smokeless tobacco: Never  Substance and Sexual Activity   Alcohol use: No   Drug use: No   Sexual activity: Not on file  Other Topics Concern   Not on file  Social History Narrative   Not on file   Social Drivers of Health   Financial Resource Strain: Not on file  Food Insecurity: Not on file  Transportation Needs: Not on file  Physical Activity: Not on file  Stress: Not on file  Social Connections: Not on file  Intimate Partner Violence: Not on file    Family History  Problem Relation Age of Onset  Heart disease Mother    Diabetes Father    Hypertension Father    Cancer Paternal Grandmother        breast   Breast cancer Paternal Grandmother    Breast cancer Paternal Aunt     No Known Allergies  Outpatient Medications Prior to Visit  Medication Sig   cetirizine (ZYRTEC ALLERGY) 10 MG tablet Take 1 tablet (10 mg total) by mouth daily.   diltiazem (CARDIZEM CD) 180 MG 24 hr capsule Take by mouth.   fexofenadine (ALLEGRA) 180 MG tablet Take 1 tablet (180 mg total) by  mouth daily.   FLUoxetine HCl 60 MG TABS TAKE 1 TABLET BY MOUTH DAILY   fluticasone (FLONASE) 50 MCG/ACT nasal spray Place 2 sprays into both nostrils daily.   hydrOXYzine (ATARAX/VISTARIL) 25 MG tablet Take 12.5-25 mg by mouth every 12 (twelve) hours as needed for anxiety.   meclizine (ANTIVERT) 25 MG tablet Take 1 tablet (25 mg total) by mouth 3 (three) times daily as needed for dizziness.   methocarbamol (ROBAXIN) 500 MG tablet Take 1 tablet (500 mg total) by mouth 4 (four) times daily.   naproxen (NAPROSYN) 500 MG tablet Take 1 tablet (500 mg total) by mouth 2 (two) times daily with a meal.   predniSONE (DELTASONE) 20 MG tablet Take 2 tablets (40 mg total) by mouth daily with breakfast.   rosuvastatin (CRESTOR) 20 MG tablet TAKE 1 TABLET BY MOUTH EVERYDAY AT BEDTIME   Semaglutide-Weight Management (WEGOVY) 2.4 MG/0.75ML SOAJ Inject 2.4 mg into the skin once a week.   spironolactone (ALDACTONE) 50 MG tablet Take 50 mg by mouth every morning.   traMADol (ULTRAM) 50 MG tablet Take 1 tablet (50 mg total) by mouth every 6 (six) hours as needed for severe pain (pain score 7-10) or moderate pain (pain score 4-6).   No facility-administered medications prior to visit.    Review of Systems  Constitutional: Negative.   HENT: Negative.    Eyes: Negative.   Respiratory: Negative.  Negative for shortness of breath.   Cardiovascular: Negative.  Negative for chest pain.  Gastrointestinal: Negative.  Negative for abdominal pain, constipation and diarrhea.  Genitourinary: Negative.   Musculoskeletal:  Negative for joint pain and myalgias.  Skin: Negative.   Neurological: Negative.  Negative for dizziness and headaches.  Endo/Heme/Allergies: Negative.   All other systems reviewed and are negative.      Objective:   BP 122/78   Pulse 91   Ht 5\' 5"  (1.651 m)   Wt 230 lb (104.3 kg)   LMP  (LMP Unknown) Comment: pt has an IUD  SpO2 96%   BMI 38.27 kg/m   Vitals:   03/28/24 1141  BP:  122/78  Pulse: 91  Height: 5\' 5"  (1.651 m)  Weight: 230 lb (104.3 kg)  SpO2: 96%  BMI (Calculated): 38.27    Physical Exam Vitals and nursing note reviewed.  Constitutional:      Appearance: Normal appearance. She is normal weight.  HENT:     Head: Normocephalic and atraumatic.      Nose: Nose normal.     Mouth/Throat:     Mouth: Mucous membranes are moist.  Eyes:     Extraocular Movements: Extraocular movements intact.     Conjunctiva/sclera: Conjunctivae normal.     Pupils: Pupils are equal, round, and reactive to light.  Cardiovascular:     Rate and Rhythm: Normal rate and regular rhythm.     Pulses: Normal pulses.     Heart sounds:  Normal heart sounds.  Pulmonary:     Effort: Pulmonary effort is normal.     Breath sounds: Normal breath sounds.  Abdominal:     General: Abdomen is flat. Bowel sounds are normal.     Palpations: Abdomen is soft.  Musculoskeletal:        General: Normal range of motion.     Cervical back: Normal range of motion.  Skin:    General: Skin is warm and dry.  Neurological:     General: No focal deficit present.     Mental Status: She is alert and oriented to person, place, and time.  Psychiatric:        Mood and Affect: Mood normal.        Behavior: Behavior normal.        Thought Content: Thought content normal.        Judgment: Judgment normal.      No results found for any visits on 03/28/24.  Recent Results (from the past 2160 hours)  POCT Urinalysis Dipstick (40981)     Status: None   Collection Time: 12/30/23  3:26 PM  Result Value Ref Range   Color, UA Clear    Clarity, UA Clear    Glucose, UA Negative Negative   Bilirubin, UA neg    Ketones, UA neg    Spec Grav, UA 1.010 1.010 - 1.025   Blood, UA neg    pH, UA 6.0 5.0 - 8.0   Protein, UA Negative Negative   Urobilinogen, UA 0.2 0.2 or 1.0 E.U./dL   Nitrite, UA neg    Leukocytes, UA Negative Negative   Appearance clear    Odor no   POCT XPERT XPRESS SARS  COVID-2/FLU/RSV     Status: None   Collection Time: 02/29/24  1:44 PM  Result Value Ref Range   SARS Coronavirus 2 Negative    FLU A Negative    FLU B Negative    RSV RNA, PCR Negative   CBC     Status: Abnormal   Collection Time: 03/04/24  2:29 PM  Result Value Ref Range   WBC 9.2 4.0 - 10.5 K/uL   RBC 5.05 3.87 - 5.11 MIL/uL   Hemoglobin 15.3 (H) 12.0 - 15.0 g/dL   HCT 19.1 47.8 - 29.5 %   MCV 89.7 80.0 - 100.0 fL   MCH 30.3 26.0 - 34.0 pg   MCHC 33.8 30.0 - 36.0 g/dL   RDW 62.1 30.8 - 65.7 %   Platelets 350 150 - 400 K/uL   nRBC 0.0 0.0 - 0.2 %    Comment: Performed at [facility], [address] [place], [place] [address]  Basic metabolic panel     Status: None   Collection Time: 03/04/24  2:29 PM  Result Value Ref Range   Sodium 136 135 - 145 mmol/L   Potassium 4.1 3.5 - 5.1 mmol/L   Chloride 103 98 - 111 mmol/L   CO2 23 22 - 32 mmol/L   Glucose, Bld 89 70 - 99 mg/dL    Comment: Glucose reference range applies only to samples taken after fasting for at least 8 hours.   BUN 12 6 - 20 mg/dL   Creatinine, Ser 2.95 0.44 - 1.00 mg/dL   Calcium 9.7 8.9 - 28.4 mg/dL   GFR, Estimated >13 >24 mL/min    Comment: (NOTE) Calculated using the CKD-EPI Creatinine Equation (2021)    Anion gap 10 5 - 15    Comment: Performed at  [facility], [address], [place], [place] [address]      Assessment & Plan:  Valtrex Gabapentin  Problem List Items Addressed This Visit       Nervous and Auditory   Herpes zoster without complication - Primary   Relevant Medications   valACYclovir (VALTREX) 1000 MG tablet   gabapentin (NEURONTIN) 300 MG capsule    Return if symptoms worsen or fail to improve.   Total time spent: 25 minutes  [employer], NP  03/28/2024   This document may have been prepared by Dragon Voice Recognition software and as such may include unintentional dictation errors.

## 2024-04-02 ENCOUNTER — Ambulatory Visit
Admission: RE | Admit: 2024-04-02 | Discharge: 2024-04-02 | Disposition: A | Discharge status: Home / Self Care | Source: Ambulatory Visit | Attending: Family | Admitting: Family

## 2024-04-02 VITALS — BP 125/86 | HR 87 | Temp 98.5°F | Resp 18

## 2024-04-02 DIAGNOSIS — R208 Other disturbances of skin sensation: Secondary | ICD-10-CM

## 2024-04-02 DIAGNOSIS — H9201 Otalgia, right ear: Secondary | ICD-10-CM | POA: Diagnosis not present

## 2024-04-02 NOTE — ED Provider Notes (Signed)
 [facility]    CSN: [id number] Arrival date & time: 04/02/24  1049      History   Chief Complaint Chief Complaint  Patient presents with   Neck Injury    Previously treated for shingles,never broke out in a rash sensitive to touch almost like a burn feel - Entered by patient    HPI Casey Hansen is a 33 y.o. female.  Patient presents with "burning" sensation on the skin of the right side of her neck x 1 week.  She also has right ear pain x 1 day.  No fever, rash, ear drainage, sore throat, difficulty swallowing, voice change.  She has been on Valtrex  for 5 days.  She has taken gabapentin  a few times but does not like the way it makes her feel so she has not taken it often.  Patient was seen by her PCP on 03/28/2024; diagnosed with herpes zoster; treated with Valtrex  and gabapentin .  The history is provided by the patient and medical records.    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   Constipation 05/06/2017   Depression with anxiety 02/04/2012   Note: Unchanged     GERD (gastroesophageal reflux disease)    Headache    Migraines/sinus   Hyperlipidemia    Hypertension    Sprain of ankle 11/09/2017   Vertigo 10/02/2016    Patient Active Problem List   Diagnosis Date Noted   Herpes zoster without complication 03/28/2024   Allergic rhinitis due to pollen 04/07/2023   Anxiety 04/07/2023   Otalgia of left ear 04/07/2023   Allergic reaction 03/19/2023   Vitamin D deficiency 11/18/2019   Obesity 11/01/2019   Essential (primary) hypertension 01/29/2018   Asthma 12/17/2017   Calculus of common duct    Abnormal findings on imaging of biliary tract    Choledocholithiasis with acute cholecystitis    Gastro-esophageal reflux disease without esophagitis 09/17/2015   Hyperlipidemia 01/06/2012    Past Surgical History:  Procedure Laterality Date   CHOLECYSTECTOMY N/A 04/22/2016   Procedure: LAPAROSCOPIC  CHOLECYSTECTOMY WITH INTRAOPERATIVE CHOLANGIOGRAM;  Surgeon: Alben Alma, MD;  Location: [facility]S;  Service: General;  Laterality: N/A;   ENDOSCOPIC RETROGRADE CHOLANGIOPANCREATOGRAPHY (ERCP) WITH PROPOFOL  N/A 04/24/2016   Procedure: ENDOSCOPIC RETROGRADE CHOLANGIOPANCREATOGRAPHY (ERCP) WITH PROPOFOL ;  Surgeon: Marnee Sink, MD;  Location: [facility];  Service: Endoscopy;  Laterality: N/A;   ESOPHAGOGASTRODUODENOSCOPY (EGD) WITH PROPOFOL  N/A 12/21/2015   Procedure: ESOPHAGOGASTRODUODENOSCOPY (EGD) WITH PROPOFOL ;  Surgeon: Stephens Eis, MD;  Location: [facility] ENDOSCOPY;  Service: Gastroenterology;  Laterality: N/A;   IMAGE GUIDED SINUS SURGERY N/A 12/26/2016   Procedure: IMAGE GUIDED SINUS SURGERY;  Surgeon: Lesly Raspberry, MD;  Location: [facility];  Service: ENT;  Laterality: N/A;   MAXILLARY ANTROSTOMY Left 12/26/2016   Procedure: MAXILLARY ANTROSTOMY;  Surgeon: Lesly Raspberry, MD;  Location: [facility];  Service: ENT;  Laterality: Left;   SEPTOPLASTY N/A 12/26/2016   Procedure: SEPTOPLASTY;  Surgeon: Lesly Raspberry, MD;  Location: [facility];  Service: ENT;  Laterality: N/A;   TURBINATE REDUCTION Bilateral 12/26/2016   Procedure: Aneta Bar REDUCTION;  Surgeon: Lesly Raspberry, MD;  Location: [facility];  Service: ENT;  Laterality: Bilateral;    OB History   No obstetric history on file.      Home Medications    Prior to Admission medications   Medication Sig Start Date End Date Taking? Authorizing Provider  cetirizine  (ZYRTEC  ALLERGY) 10 MG tablet Take  1 tablet (10 mg total) by mouth daily. 03/17/23 03/16/24  Boswell, Chelsa, NP  diltiazem (CARDIZEM CD) 180 MG 24 hr capsule Take by mouth. 03/10/23 03/14/24  [provider]  fexofenadine  (ALLEGRA ) 180 MG tablet Take 1 tablet (180 mg total) by mouth daily. 03/14/24   Trenda Frisk, FNP  FLUoxetine  HCl 60 MG TABS TAKE 1 TABLET BY MOUTH DAILY 01/27/24   Trenda Frisk, FNP  fluticasone  (FLONASE ) 50  MCG/ACT nasal spray Place 2 sprays into both nostrils daily. 03/14/24 03/14/25  Trenda Frisk, FNP  gabapentin  (NEURONTIN ) 300 MG capsule Take 300 mg daily for one day, then 300 mg twice daily for one day, then 300 mg three times a day for 5 days 03/28/24   Scoggins, Amber, NP  hydrOXYzine (ATARAX/VISTARIL) 25 MG tablet Take 12.5-25 mg by mouth every 12 (twelve) hours as needed for anxiety. 05/19/18   [provider]  meclizine  (ANTIVERT ) 25 MG tablet Take 1 tablet (25 mg total) by mouth 3 (three) times daily as needed for dizziness. 03/04/24   Marylynn Soho, MD  methocarbamol  (ROBAXIN ) 500 MG tablet Take 1 tablet (500 mg total) by mouth 4 (four) times daily. 01/26/24   Noble Bateman, PA  naproxen  (NAPROSYN ) 500 MG tablet Take 1 tablet (500 mg total) by mouth 2 (two) times daily with a meal. 01/26/24 01/25/25  Noble Bateman, PA  predniSONE  (DELTASONE ) 20 MG tablet Take 2 tablets (40 mg total) by mouth daily with breakfast. Patient not taking: Reported on 04/02/2024 03/14/24   Trenda Frisk, FNP  rosuvastatin  (CRESTOR ) 20 MG tablet TAKE 1 TABLET BY MOUTH EVERYDAY AT BEDTIME 01/26/24   Trenda Frisk, FNP  Semaglutide -Weight Management (WEGOVY ) 2.4 MG/0.75ML SOAJ Inject 2.4 mg into the skin once a week. 03/14/24   Trenda Frisk, FNP  spironolactone (ALDACTONE) 50 MG tablet Take 50 mg by mouth every morning. 07/20/23   [provider]  traMADol  (ULTRAM ) 50 MG tablet Take 1 tablet (50 mg total) by mouth every 6 (six) hours as needed for severe pain (pain score 7-10) or moderate pain (pain score 4-6). 01/21/24   Trenda Frisk, FNP  valACYclovir  (VALTREX ) 1000 MG tablet Take 2 tablets (2,000 mg total) by mouth 3 (three) times daily for 7 days. 03/28/24 04/04/24  Scoggins, Amber, NP    Family History Family History  Problem Relation Age of Onset   Heart disease Mother    Diabetes Father    Hypertension Father    Cancer Paternal Grandmother        breast   Breast cancer  Paternal Grandmother    Breast cancer Paternal Aunt     Social History Social History   Tobacco Use   Smoking status: Never   Smokeless tobacco: Never  Substance Use Topics   Alcohol use: No   Drug use: No     Allergies   Patient has no known allergies.   Review of Systems Review of Systems  Constitutional:  Negative for chills and fever.  HENT:  Positive for ear pain. Negative for ear discharge, sore throat, trouble swallowing and voice change.   Respiratory:  Negative for cough and shortness of breath.   Skin:  Negative for color change, rash and wound.     Physical Exam Triage Vital Signs ED Triage Vitals [04/02/24 1117]  Encounter Vitals Group     BP 125/86     Systolic BP Percentile      Diastolic BP Percentile  Pulse Rate 87     Resp 18     Temp 98.5 F (36.9 C)     Temp src      SpO2 97 %     Weight      Height      Head Circumference      Peak Flow      Pain Score      Pain Loc      Pain Education      Exclude from Growth Chart    No data found.  Updated Vital Signs BP 125/86   Pulse 87   Temp 98.5 F (36.9 C)   Resp 18   LMP  (LMP Unknown) Comment: pt has an IUD  SpO2 97%   Visual Acuity Right Eye Distance:   Left Eye Distance:   Bilateral Distance:    Right Eye Near:   Left Eye Near:    Bilateral Near:     Physical Exam Constitutional:      General: She is not in acute distress. HENT:     Right Ear: Tympanic membrane and ear canal normal.     Left Ear: Tympanic membrane and ear canal normal.     Nose: Nose normal.     Mouth/Throat:     Mouth: Mucous membranes are moist.     Pharynx: Oropharynx is clear.  Cardiovascular:     Rate and Rhythm: Normal rate and regular rhythm.     Heart sounds: Normal heart sounds.  Pulmonary:     Effort: Pulmonary effort is normal. No respiratory distress.     Breath sounds: Normal breath sounds.  Skin:    General: Skin is warm and dry.     Findings: No bruising, erythema, lesion or  rash.     Comments: Skin on right side of neck is tender to light touch.  No rash.  Neurological:     Mental Status: She is alert.      UC Treatments / Results  Labs (all labs ordered are listed, but only abnormal results are displayed) Labs Reviewed - No data to display  EKG   Radiology No results found.  Procedures Procedures (including critical care time)  Medications Ordered in UC Medications - No data to display  Initial Impression / Assessment and Plan / UC Course  I have reviewed the triage vital signs and the nursing notes.  Pertinent labs & imaging results that were available during my care of the patient were reviewed by me and considered in my medical decision making (see chart for details).    Burning sensation of skin, right otalgia.  Afebrile and vital signs are stable.  Instructed patient to continue the Valtrex  and gabapentin  as prescribed by her doctor.  Tylenol  as needed.  Instructed her to follow-up with her PCP on Monday if her symptoms are not improving.  She agrees to plan of care.  Final Clinical Impressions(s) / UC Diagnoses   Final diagnoses:  Burning sensation of skin  Otalgia of right ear     Discharge Instructions      Continue the Valtrex  and gabapentin  as directed.  Follow-up with your primary care provider on Monday if your symptoms are not improving.     ED Prescriptions   None    I have reviewed the PDMP during this encounter.   Wellington Half, NP 04/02/24 [phone]

## 2024-04-02 NOTE — ED Triage Notes (Signed)
 Patient to Urgent Care with complaints of right sided neck and ear pain. Describing "burning pain" tender to touch. Denies any fevers.   Reports symptoms started one week ago. States she was treated for shingles by her pcp w/ valtrex  and gabapentin . Feels like the pain is worsening. No rash/ blisters.

## 2024-04-02 NOTE — Discharge Instructions (Signed)
 Continue the Valtrex  and gabapentin  as directed.  Follow-up with your primary care provider on Monday if your symptoms are not improving.

## 2024-04-13 ENCOUNTER — Encounter: Payer: Self-pay | Admitting: Family

## 2024-04-13 ENCOUNTER — Ambulatory Visit: Admitting: Family

## 2024-04-13 VITALS — BP 126/78 | HR 91 | Ht 65.0 in | Wt 234.0 lb

## 2024-04-13 DIAGNOSIS — R102 Pelvic and perineal pain: Secondary | ICD-10-CM

## 2024-04-13 DIAGNOSIS — R1084 Generalized abdominal pain: Secondary | ICD-10-CM

## 2024-04-13 MED ORDER — AZELASTINE HCL 0.1 % NA SOLN: 1.0000 | Freq: Two times a day (BID) | NASAL | 0 refills | Status: DC

## 2024-04-13 MED ORDER — DESVENLAFAXINE SUCCINATE ER 50 MG PO TB24: 50.0000 mg | ORAL_TABLET | Freq: Every day | ORAL | 0 refills | Status: DC

## 2024-04-15 ENCOUNTER — Ambulatory Visit: Admitting: Family

## 2024-04-15 DIAGNOSIS — R300 Dysuria: Secondary | ICD-10-CM | POA: Diagnosis not present

## 2024-04-15 LAB — POCT URINALYSIS DIPSTICK
Bilirubin, UA: NEGATIVE
Blood, UA: NEGATIVE
Glucose, UA: NEGATIVE
Leukocytes, UA: NEGATIVE
Nitrite, UA: NEGATIVE
Protein, UA: NEGATIVE
Spec Grav, UA: >=1.03 — AB (ref 1.010–1.025)
Urobilinogen, UA: 0.2 U/dL
pH, UA: 6 (ref 5.0–8.0)

## 2024-04-28 ENCOUNTER — Other Ambulatory Visit: Payer: Self-pay | Admitting: Family

## 2024-05-01 NOTE — Progress Notes (Signed)
   CHIEF COMPLAINT  UA/ only visit fot UTI     REASON FOR VISIT  Possible UTI, UA Visit Only      ASSESSMENT & PLAN Diagnoses and all orders for this visit:  Dysuria -     POCT Urinalysis Dipstick (16109)   Patient notified.  Total time spent: 5 minutes  SOLVEIG FANGMAN, FNP 04/15/2024

## 2024-05-05 ENCOUNTER — Other Ambulatory Visit: Payer: Self-pay | Admitting: Family

## 2024-05-10 ENCOUNTER — Inpatient Hospital Stay: Admission: RE | Admit: 2024-05-10 | Source: Ambulatory Visit

## 2024-05-10 ENCOUNTER — Other Ambulatory Visit

## 2024-05-13 ENCOUNTER — Encounter: Payer: Self-pay | Admitting: Family

## 2024-05-13 ENCOUNTER — Ambulatory Visit: Admitting: Family

## 2024-05-13 VITALS — BP 112/88 | HR 100 | Ht 65.0 in | Wt 230.4 lb

## 2024-05-13 DIAGNOSIS — E66812 Obesity, class 2: Secondary | ICD-10-CM

## 2024-05-13 DIAGNOSIS — Z0130 Encounter for examination of blood pressure without abnormal findings: Secondary | ICD-10-CM

## 2024-05-13 DIAGNOSIS — Z6839 Body mass index (BMI) 39.0-39.9, adult: Secondary | ICD-10-CM

## 2024-05-13 MED ORDER — CHOLESTYRAMINE 4 G PO PACK: 4.0000 g | PACK | Freq: Three times a day (TID) | ORAL | 12 refills | Status: DC

## 2024-05-13 MED ORDER — OFLOXACIN 0.3 % OT SOLN: 5.0000 [drp] | Freq: Every day | OTIC | 0 refills | Status: DC

## 2024-05-13 MED ORDER — BUPROPION HCL ER (XL) 150 MG PO TB24: 150.0000 mg | ORAL_TABLET | Freq: Every day | ORAL | 1 refills | Status: DC

## 2024-06-05 ENCOUNTER — Other Ambulatory Visit: Payer: Self-pay | Admitting: Family

## 2024-07-17 ENCOUNTER — Other Ambulatory Visit: Payer: Self-pay | Admitting: Family

## 2024-07-24 NOTE — Assessment & Plan Note (Signed)
 Continue current meds.  Will adjust as needed based on results.  The patient is asked to make an attempt to improve diet and exercise patterns to aid in medical management of this problem. Addressed importance of increasing and maintaining water intake.

## 2024-07-24 NOTE — Progress Notes (Signed)
 Established Patient Office Visit  Subjective:  Patient ID: Casey Hansen, female    DOB: [birth year]  Age: 33 y.o. MRN: [id number]  Chief Complaint  Patient presents with   Follow-up    1 month follow up    Patient is here today for her 1 month follow up.  She has been feeling fairly well since last appointment.   She does not have additional concerns to discuss today.  Labs are not due today.  She needs refills.   I have reviewed her active problem list, medication list, allergies, notes from last encounter, lab results for her appointment today.      No other concerns at this time.   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   Constipation 05/06/2017   Depression with anxiety 02/04/2012   Note: Unchanged     GERD (gastroesophageal reflux disease)    Headache    Migraines/sinus   Hyperlipidemia    Hypertension    Sprain of ankle 11/09/2017   Vertigo 10/02/2016    Past Surgical History:  Procedure Laterality Date   CHOLECYSTECTOMY N/A 04/22/2016   Procedure: LAPAROSCOPIC CHOLECYSTECTOMY WITH INTRAOPERATIVE CHOLANGIOGRAM;  Surgeon: Laneta JULIANNA Luna, MD;  Location: [facility] OR[facility];  Service: General;  Laterality: N/A;   ENDOSCOPIC RETROGRADE CHOLANGIOPANCREATOGRAPHY (ERCP) WITH PROPOFOL  N/A 04/24/2016   Procedure: ENDOSCOPIC RETROGRADE CHOLANGIOPANCREATOGRAPHY (ERCP) WITH PROPOFOL ;  Surgeon: Rogelia Copping, MD;  Location: [facility] ENDOSCOPY;  Service: Endoscopy;  Laterality: N/A;   ESOPHAGOGASTRODUODENOSCOPY (EGD) WITH PROPOFOL  N/A 12/21/2015   Procedure: ESOPHAGOGASTRODUODENOSCOPY (EGD) WITH PROPOFOL ;  Surgeon: Deward CINDERELLA Piedmont, MD;  Location: [facility] ENDOSCOPY;  Service: Gastroenterology;  Laterality: N/A;   IMAGE GUIDED SINUS SURGERY N/A 12/26/2016   Procedure: IMAGE GUIDED SINUS SURGERY;  Surgeon: Chinita Hasten, MD;  Location: [facility];  Service: ENT;  Laterality: N/A;   MAXILLARY ANTROSTOMY Left 12/26/2016   Procedure: MAXILLARY  ANTROSTOMY;  Surgeon: Chinita Hasten, MD;  Location: [facility];  Service: ENT;  Laterality: Left;   SEPTOPLASTY N/A 12/26/2016   Procedure: SEPTOPLASTY;  Surgeon: Chinita Hasten, MD;  Location: [facility];  Service: ENT;  Laterality: N/A;   TURBINATE REDUCTION Bilateral 12/26/2016   Procedure: MONTA REDUCTION;  Surgeon: Chinita Hasten, MD;  Location: [facility];  Service: ENT;  Laterality: Bilateral;    Social History   Socioeconomic History   Marital status: Married    Spouse name: Not on file   Number of children: Not on file   Years of education: Not on file   Highest education level: Not on file  Occupational History   Not on file  Tobacco Use   Smoking status: Never   Smokeless tobacco: Never  Substance and Sexual Activity   Alcohol use: No   Drug use: No   Sexual activity: Not on file  Other Topics Concern   Not on file  Social History Narrative   Not on file   Social Drivers of Health   Financial Resource Strain: Not on file  Food Insecurity: Not on file  Transportation Needs: Not on file  Physical Activity: Not on file  Stress: Not on file  Social Connections: Not on file  Intimate Partner Violence: Not on file    Family History  Problem Relation Age of Onset   Heart disease Mother    Diabetes Father    Hypertension Father    Cancer Paternal Grandmother        breast   Breast cancer  Paternal Grandmother    Breast cancer Paternal Aunt     No Known Allergies  Review of Systems  All other systems reviewed and are negative.      Objective:   BP 112/88   Pulse 100   Ht 5' 5 (1.651 m)   Wt 230 lb 6.4 oz (104.5 kg)   LMP  (LMP Unknown) Comment: pt has an IUD  SpO2 98%   BMI 38.34 kg/m   Vitals:   05/13/24 1323  BP: 112/88  Pulse: 100  Height: 5' 5 (1.651 m)  Weight: 230 lb 6.4 oz (104.5 kg)  SpO2: 98%  BMI (Calculated): 38.34    Physical Exam Vitals and nursing note reviewed.  Constitutional:       Appearance: Normal appearance. She is normal weight.  HENT:     Head: Normocephalic.  Eyes:     Extraocular Movements: Extraocular movements intact.     Conjunctiva/sclera: Conjunctivae normal.     Pupils: Pupils are equal, round, and reactive to light.  Cardiovascular:     Rate and Rhythm: Normal rate.  Pulmonary:     Effort: Pulmonary effort is normal.  Neurological:     General: No focal deficit present.     Mental Status: She is alert and oriented to person, place, and time. Mental status is at baseline.  Psychiatric:        Mood and Affect: Mood normal.        Behavior: Behavior normal.        Thought Content: Thought content normal.        Judgment: Judgment normal.      No results found for any visits on 05/13/24.  No results found for this or any previous visit (from the past 2160 hours).      Assessment & Plan Class 2 severe obesity due to excess calories with serious comorbidity and body mass index (BMI) of 39.0 to 39.9 in adult [facility]) Continue current meds.  Will adjust as needed based on results.  The patient is asked to make an attempt to improve diet and exercise patterns to aid in medical management of this problem. Addressed importance of increasing and maintaining water intake.      Return in about 3 months (around 08/13/2024).   Total time spent: 20 minutes  LIND AUSLEY, FNP  05/13/2024   This document may have been prepared by [facility] Voice Recognition software and as such may include unintentional dictation errors.

## 2024-09-26 ENCOUNTER — Encounter: Payer: Self-pay | Admitting: Family

## 2024-09-26 ENCOUNTER — Ambulatory Visit: Admitting: Family

## 2024-09-26 VITALS — BP 118/86 | HR 80 | Ht 65.0 in | Wt 208.0 lb

## 2024-09-26 DIAGNOSIS — F419 Anxiety disorder, unspecified: Secondary | ICD-10-CM | POA: Diagnosis not present

## 2024-09-26 DIAGNOSIS — M25551 Pain in right hip: Secondary | ICD-10-CM

## 2024-09-26 MED ORDER — DESVENLAFAXINE SUCCINATE ER 100 MG PO TB24: 100.0000 mg | ORAL_TABLET | Freq: Every day | ORAL | 3 refills | Status: DC

## 2024-09-26 NOTE — Progress Notes (Signed)
 Established Patient Office Visit  Subjective:  Patient ID: Casey Hansen, female    DOB: 10/19/1991  Age: 33 y.o. MRN: [id number]  Chief Complaint  Patient presents with   Follow-up    Discuss medications    Patient here today for follow up Casey Hansen reports that Casey Hansen has been having significant issues with anger lately, and that Casey Hansen does not feel that her medications are controlling her symptoms any more.  Casey Hansen does endorse more stressors than normal   Has been on this combination of medications for quite some time.     No other concerns at this time.   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   Constipation 05/06/2017   Depression with anxiety 02/04/2012   Note: Unchanged     GERD (gastroesophageal reflux disease)    Headache    Migraines/sinus   Hyperlipidemia    Hypertension    Sprain of ankle 11/09/2017   Vertigo 10/02/2016    Past Surgical History:  Procedure Laterality Date   CHOLECYSTECTOMY N/A 04/22/2016   Procedure: LAPAROSCOPIC CHOLECYSTECTOMY WITH INTRAOPERATIVE CHOLANGIOGRAM;  Surgeon: Laneta JULIANNA Luna, MD;  Location: [facility] ORS;  Service: General;  Laterality: N/A;   ENDOSCOPIC RETROGRADE CHOLANGIOPANCREATOGRAPHY (ERCP) WITH PROPOFOL  N/A 04/24/2016   Procedure: ENDOSCOPIC RETROGRADE CHOLANGIOPANCREATOGRAPHY (ERCP) WITH PROPOFOL ;  Surgeon: Rogelia Copping, MD;  Location: [facility] ENDOSCOPY;  Service: Endoscopy;  Laterality: N/A;   ESOPHAGOGASTRODUODENOSCOPY (EGD) WITH PROPOFOL  N/A 12/21/2015   Procedure: ESOPHAGOGASTRODUODENOSCOPY (EGD) WITH PROPOFOL ;  Surgeon: Deward CINDERELLA Piedmont, MD;  Location: [facility] ENDOSCOPY;  Service: Gastroenterology;  Laterality: N/A;   IMAGE GUIDED SINUS SURGERY N/A 12/26/2016   Procedure: IMAGE GUIDED SINUS SURGERY;  Surgeon: Chinita Hasten, MD;  Location: [facility];  Service: ENT;  Laterality: N/A;   MAXILLARY ANTROSTOMY Left 12/26/2016   Procedure: MAXILLARY ANTROSTOMY;  Surgeon: Chinita Hasten, MD;   Location: [facility];  Service: ENT;  Laterality: Left;   SEPTOPLASTY N/A 12/26/2016   Procedure: SEPTOPLASTY;  Surgeon: Chinita Hasten, MD;  Location: [facility] SURGERY [facility];  Service: ENT;  Laterality: N/A;   TURBINATE REDUCTION Bilateral 12/26/2016   Procedure: MONTA REDUCTION;  Surgeon: Chinita Hasten, MD;  Location: [facility];  Service: ENT;  Laterality: Bilateral;    Social History   Socioeconomic History   Marital status: Married    Spouse name: Not on file   Number of children: Not on file   Years of education: Not on file   Highest education level: Not on file  Occupational History   Not on file  Tobacco Use   Smoking status: Never   Smokeless tobacco: Never  Substance and Sexual Activity   Alcohol use: No   Drug use: No   Sexual activity: Not on file  Other Topics Concern   Not on file  Social History Narrative   Not on file   Social Drivers of Health   Financial Resource Strain: Not on file  Food Insecurity: Not on file  Transportation Needs: Not on file  Physical Activity: Not on file  Stress: Not on file  Social Connections: Not on file  Intimate Partner Violence: Not on file    Family History  Problem Relation Age of Onset   Heart disease Mother    Diabetes Father    Hypertension Father    Cancer Paternal Grandmother        breast   Breast cancer Paternal Grandmother    Breast cancer Paternal Aunt  No Known Allergies  Review of Systems  Psychiatric/Behavioral:  The patient is nervous/anxious.        Irritability Anger outbursts  All other systems reviewed and are negative.      Objective:   BP 118/86   Pulse 80   Ht 5' 5 (1.651 m)   Wt 208 lb (94.3 kg)   LMP  (LMP Unknown) Comment: pt has an IUD  SpO2 98%   BMI 34.61 kg/m   Vitals:   09/26/24 1310  BP: 118/86  Pulse: 80  Height: 5' 5 (1.651 m)  Weight: 208 lb (94.3 kg)  SpO2: 98%  BMI (Calculated): 34.61    Physical Exam Vitals and nursing note  reviewed.  Constitutional:      Appearance: Normal appearance. Casey Hansen is normal weight.  HENT:     Head: Normocephalic.  Eyes:     Extraocular Movements: Extraocular movements intact.     Conjunctiva/sclera: Conjunctivae normal.     Pupils: Pupils are equal, round, and reactive to light.  Cardiovascular:     Rate and Rhythm: Normal rate.  Pulmonary:     Effort: Pulmonary effort is normal.  Neurological:     General: No focal deficit present.     Mental Status: Casey Hansen is alert and oriented to person, place, and time. Mental status is at baseline.  Psychiatric:        Mood and Affect: Mood normal.        Behavior: Behavior normal.        Thought Content: Thought content normal.        Judgment: Judgment normal.      No results found for any visits on 09/26/24.  No results found for this or any previous visit (from the past 2160 hours).     Assessment & Plan Right hip pain Xr of Right Hip today.  Will call with results when available.   Anxiety Increasing dose of pristiq .  Will reassess at follow up and determine if we need to increase wellbutrin  dosing as well.     Return in about 2 weeks (around 10/10/2024).   Total time spent: 20 minutes  ARLETHA MARSCHKE, FNP  09/26/2024   This document may have been prepared by [facility] Voice Recognition software and as such may include unintentional dictation errors.

## 2024-09-26 NOTE — Assessment & Plan Note (Signed)
 Increasing dose of pristiq .  Will reassess at follow up and determine if we need to increase wellbutrin  dosing as well.

## 2024-09-28 ENCOUNTER — Other Ambulatory Visit: Payer: Self-pay | Admitting: Family

## 2024-09-28 DIAGNOSIS — R222 Localized swelling, mass and lump, trunk: Secondary | ICD-10-CM

## 2024-09-30 ENCOUNTER — Ambulatory Visit
Admission: RE | Admit: 2024-09-30 | Discharge: 2024-09-30 | Disposition: A | Discharge status: Home / Self Care | Source: Ambulatory Visit | Attending: Family | Admitting: Family

## 2024-09-30 DIAGNOSIS — R222 Localized swelling, mass and lump, trunk: Secondary | ICD-10-CM | POA: Insufficient documentation

## 2024-10-03 ENCOUNTER — Ambulatory Visit: Payer: Self-pay

## 2024-10-10 ENCOUNTER — Ambulatory Visit: Admitting: Family

## 2024-10-10 ENCOUNTER — Encounter: Payer: Self-pay | Admitting: Family

## 2024-10-10 VITALS — BP 108/82 | HR 84 | Ht 65.0 in | Wt 206.0 lb

## 2024-10-10 DIAGNOSIS — R222 Localized swelling, mass and lump, trunk: Secondary | ICD-10-CM | POA: Diagnosis not present

## 2024-10-10 DIAGNOSIS — Z0130 Encounter for examination of blood pressure without abnormal findings: Secondary | ICD-10-CM

## 2024-10-19 ENCOUNTER — Other Ambulatory Visit: Payer: Self-pay | Admitting: Family

## 2024-10-23 ENCOUNTER — Encounter: Payer: Self-pay | Admitting: Family

## 2024-10-23 NOTE — Progress Notes (Signed)
 Established Patient Office Visit  Subjective:  Patient ID: Casey Hansen, female    DOB: 24-Nov-1991  Age: 33 y.o. MRN: [id number]  Chief Complaint  Patient presents with   Follow-up    2 week follow up    Patient is here today for her 2 week follow up.  She has been feeling fairly well since last appointment.   She does have additional concerns to discuss today.  She has a lump at the base of her sternum that popped up seemingly overnight and is tender to the touch.  The lump is semi firm and approximately 2 in diameter.  An ultrasound did not find any cause for the lump, though it did note a prominent xiphoid process.  However, the lump in question does not feel bony.   Labs are not due today.  She needs refills.   I have reviewed her active problem list, medication list, allergies, notes from last encounter, lab results for her appointment today.      No other concerns at this time.   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   Constipation 05/06/2017   Depression with anxiety 02/04/2012   Note: Unchanged     GERD (gastroesophageal reflux disease)    Headache    Migraines/sinus   Hyperlipidemia    Hypertension    Sprain of ankle 11/09/2017   Vertigo 10/02/2016    Past Surgical History:  Procedure Laterality Date   CHOLECYSTECTOMY N/A 04/22/2016   Procedure: LAPAROSCOPIC CHOLECYSTECTOMY WITH INTRAOPERATIVE CHOLANGIOGRAM;  Surgeon: Laneta JULIANNA Luna, MD;  Location: [facility];  Service: General;  Laterality: N/A;   ENDOSCOPIC RETROGRADE CHOLANGIOPANCREATOGRAPHY (ERCP) WITH PROPOFOL  N/A 04/24/2016   Procedure: ENDOSCOPIC RETROGRADE CHOLANGIOPANCREATOGRAPHY (ERCP) WITH PROPOFOL ;  Surgeon: Rogelia Copping, MD;  Location: [facility];  Service: Endoscopy;  Laterality: N/A;   ESOPHAGOGASTRODUODENOSCOPY (EGD) WITH PROPOFOL  N/A 12/21/2015   Procedure: ESOPHAGOGASTRODUODENOSCOPY (EGD) WITH PROPOFOL ;  Surgeon: Deward CINDERELLA Piedmont, MD;   Location: [facility];  Service: Gastroenterology;  Laterality: N/A;   IMAGE GUIDED SINUS SURGERY N/A 12/26/2016   Procedure: IMAGE GUIDED SINUS SURGERY;  Surgeon: Chinita Hasten, MD;  Location: [facility];  Service: ENT;  Laterality: N/A;   MAXILLARY ANTROSTOMY Left 12/26/2016   Procedure: MAXILLARY ANTROSTOMY;  Surgeon: Chinita Hasten, MD;  Location: [facility];  Service: ENT;  Laterality: Left;   SEPTOPLASTY N/A 12/26/2016   Procedure: SEPTOPLASTY;  Surgeon: Chinita Hasten, MD;  Location: [facility];  Service: ENT;  Laterality: N/A;   TURBINATE REDUCTION Bilateral 12/26/2016   Procedure: MONTA REDUCTION;  Surgeon: Chinita Hasten, MD;  Location: [facility];  Service: ENT;  Laterality: Bilateral;    Social History   Socioeconomic History   Marital status: Married    Spouse name: Not on file   Number of children: Not on file   Years of education: Not on file   Highest education level: Not on file  Occupational History   Not on file  Tobacco Use   Smoking status: Never   Smokeless tobacco: Never  Substance and Sexual Activity   Alcohol use: No   Drug use: No   Sexual activity: Not on file  Other Topics Concern   Not on file  Social History Narrative   Not on file   Social Drivers of Health   Financial Resource Strain: Not on file  Food Insecurity: Not on file  Transportation Needs: Not on file  Physical Activity: Not on file  Stress: Not on file  Social Connections: Not on file  Intimate Partner Violence: Not on file    Family History  Problem Relation Age of Onset   Heart disease Mother    Diabetes Father    Hypertension Father    Cancer Paternal Grandmother        breast   Breast cancer Paternal Grandmother    Breast cancer Paternal Aunt     No Known Allergies  Review of Systems  All other systems reviewed and are negative.      Objective:   BP 108/82   Pulse 84   Ht 5' 5 (1.651 m)   Wt 206 lb (93.4 kg)    LMP  (LMP Unknown) Comment: pt has an IUD  SpO2 97%   BMI 34.28 kg/m   Vitals:   10/10/24 1249  BP: 108/82  Pulse: 84  Height: 5' 5 (1.651 m)  Weight: 206 lb (93.4 kg)  SpO2: 97%  BMI (Calculated): 34.28    Physical Exam Vitals and nursing note reviewed.  Constitutional:      Appearance: Normal appearance. She is normal weight.  HENT:     Head: Normocephalic.  Eyes:     Extraocular Movements: Extraocular movements intact.     Conjunctiva/sclera: Conjunctivae normal.     Pupils: Pupils are equal, round, and reactive to light.  Cardiovascular:     Rate and Rhythm: Normal rate.  Pulmonary:     Effort: Pulmonary effort is normal.  Chest:     Chest wall: Mass (Lump at base of sternum.) present.    Neurological:     General: No focal deficit present.     Mental Status: She is alert and oriented to person, place, and time. Mental status is at baseline.  Psychiatric:        Mood and Affect: Mood normal.        Behavior: Behavior normal.        Thought Content: Thought content normal.        Judgment: Judgment normal.      No results found for any visits on 10/10/24.  No results found for this or any previous visit (from the past 2160 hours).     Assessment & Plan Lump in chest Given that US  was non-diagnostic, next step would be to get an MRI w and w/o contrast of the area.  We will order this today and see if we are able to determine what the lump is.     Return to be determined based on result of MRI.   Total time spent: 20 minutes  MALIAH PYLES, FNP  10/10/2024   This document may have been prepared by [facility] Voice Recognition software and as such may include unintentional dictation errors.

## 2024-10-28 ENCOUNTER — Ambulatory Visit
Admission: RE | Admit: 2024-10-28 | Discharge: 2024-10-28 | Disposition: A | Discharge status: Home / Self Care | Source: Ambulatory Visit | Attending: Family | Admitting: Family

## 2024-10-28 DIAGNOSIS — R222 Localized swelling, mass and lump, trunk: Secondary | ICD-10-CM

## 2024-10-28 MED ORDER — GADOPICLENOL 0.5 MMOL/ML IV SOLN
10.0000 mL | Freq: Once | INTRAVENOUS | Status: AC | PRN
  Administered 2024-10-28: 10 mL via INTRAVENOUS

## 2024-11-07 ENCOUNTER — Ambulatory Visit (INDEPENDENT_AMBULATORY_CARE_PROVIDER_SITE_OTHER): Admitting: Family

## 2024-11-07 DIAGNOSIS — R300 Dysuria: Secondary | ICD-10-CM

## 2024-11-07 DIAGNOSIS — N760 Acute vaginitis: Secondary | ICD-10-CM | POA: Diagnosis not present

## 2024-11-07 LAB — POCT URINALYSIS DIPSTICK
Bilirubin, UA: NEGATIVE
Blood, UA: NEGATIVE
Glucose, UA: NEGATIVE
Ketones, UA: NEGATIVE
Leukocytes, UA: NEGATIVE
Nitrite, UA: NEGATIVE
Protein, UA: POSITIVE — AB
Spec Grav, UA: >=1.03 — AB (ref 1.010–1.025)
Urobilinogen, UA: 0.2 U/dL
pH, UA: 5.5 (ref 5.0–8.0)

## 2024-11-09 ENCOUNTER — Other Ambulatory Visit: Payer: Self-pay | Admitting: Family

## 2024-11-09 LAB — NUSWAB VAGINITIS PLUS (VG+)
Atopobium vaginae: HIGH {score} — AB
BVAB 2: HIGH {score} — AB
Candida albicans, NAA: NEGATIVE
Candida glabrata, NAA: NEGATIVE
Chlamydia trachomatis, NAA: NEGATIVE
Megasphaera 1: HIGH {score} — AB
Neisseria gonorrhoeae, NAA: NEGATIVE
Trich vag by NAA: NEGATIVE

## 2024-11-09 MED ORDER — METRONIDAZOLE 500 MG PO TABS: 500.0000 mg | ORAL_TABLET | Freq: Two times a day (BID) | ORAL | 0 refills | Status: AC

## 2024-11-09 NOTE — Progress Notes (Signed)
   CHIEF COMPLAINT  UA/ only visit fot UTI     REASON FOR VISIT  Possible UTI, UA Visit Only      ASSESSMENT & PLAN Diagnoses and all orders for this visit:  Dysuria -     POCT Urinalysis Dipstick (18997)  Acute vaginitis -     NuSwab Vaginitis Plus (VG+)     Patient notified.  Total time spent: 5 minutes  MARGARETA LAUREANO, FNP 11/07/2024

## 2024-11-10 ENCOUNTER — Other Ambulatory Visit: Payer: Self-pay | Admitting: Family

## 2024-12-22 ENCOUNTER — Other Ambulatory Visit: Payer: Self-pay | Admitting: Family

## 2024-12-30 ENCOUNTER — Ambulatory Visit: Admitting: Family

## 2024-12-30 ENCOUNTER — Encounter: Payer: Self-pay | Admitting: Family

## 2024-12-30 VITALS — BP 116/80 | HR 87 | Ht 65.0 in | Wt 200.0 lb

## 2024-12-30 DIAGNOSIS — M25552 Pain in left hip: Secondary | ICD-10-CM

## 2024-12-30 DIAGNOSIS — E66812 Obesity, class 2: Secondary | ICD-10-CM | POA: Diagnosis not present

## 2024-12-30 DIAGNOSIS — I10 Essential (primary) hypertension: Secondary | ICD-10-CM | POA: Diagnosis not present

## 2024-12-30 DIAGNOSIS — E782 Mixed hyperlipidemia: Secondary | ICD-10-CM

## 2024-12-30 DIAGNOSIS — F419 Anxiety disorder, unspecified: Secondary | ICD-10-CM | POA: Diagnosis not present

## 2024-12-30 DIAGNOSIS — E559 Vitamin D deficiency, unspecified: Secondary | ICD-10-CM | POA: Diagnosis not present

## 2024-12-30 DIAGNOSIS — Z6839 Body mass index (BMI) 39.0-39.9, adult: Secondary | ICD-10-CM | POA: Diagnosis not present

## 2024-12-30 MED ORDER — DESVENLAFAXINE SUCCINATE ER 100 MG PO TB24: 100.0000 mg | ORAL_TABLET | Freq: Every day | ORAL | 2 refills | Status: AC

## 2024-12-30 MED ORDER — BUPROPION HCL ER (XL) 150 MG PO TB24: 150.0000 mg | ORAL_TABLET | Freq: Every day | ORAL | 1 refills | Status: AC

## 2024-12-30 MED ORDER — DESVENLAFAXINE SUCCINATE ER 50 MG PO TB24: 50.0000 mg | ORAL_TABLET | Freq: Every day | ORAL | 1 refills | Status: AC

## 2024-12-31 NOTE — Progress Notes (Signed)
 "  Established Patient Office Visit  Subjective:  Patient ID: Casey Hansen, female    DOB: December 12, 1991  Age: 34 y.o. MRN: [id number]  Chief Complaint  Patient presents with   Follow-up    Medication refills    Patient is here today for her follow up.  She has been feeling fairly well since last appointment.   She does have additional concerns to discuss today. She has been having additional trouble with her OCD symptoms, says that she is irritable much more frequently and she has been having more concerns regarding her anxiety around things like turning off her stove or other similar issues.  Labs are not due today.  She needs refills.   I have reviewed her active problem list, medication list, allergies, notes from last encounter, lab results for her appointment today.      No other concerns at this time.   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   Constipation 05/06/2017   Depression with anxiety 02/04/2012   Note: Unchanged     GERD (gastroesophageal reflux disease)    Headache    Migraines/sinus   Hyperlipidemia    Hypertension    Sprain of ankle 11/09/2017   Vertigo 10/02/2016    Past Surgical History:  Procedure Laterality Date   CHOLECYSTECTOMY N/A 04/22/2016   Procedure: LAPAROSCOPIC CHOLECYSTECTOMY WITH INTRAOPERATIVE CHOLANGIOGRAM;  Surgeon: Laneta JULIANNA Luna, MD;  Location: [facility];  Service: General;  Laterality: N/A;   ENDOSCOPIC RETROGRADE CHOLANGIOPANCREATOGRAPHY (ERCP) WITH PROPOFOL  N/A 04/24/2016   Procedure: ENDOSCOPIC RETROGRADE CHOLANGIOPANCREATOGRAPHY (ERCP) WITH PROPOFOL ;  Surgeon: Rogelia Copping, MD;  Location: [facility];  Service: Endoscopy;  Laterality: N/A;   ESOPHAGOGASTRODUODENOSCOPY (EGD) WITH PROPOFOL  N/A 12/21/2015   Procedure: ESOPHAGOGASTRODUODENOSCOPY (EGD) WITH PROPOFOL ;  Surgeon: Deward CINDERELLA Piedmont, MD;  Location: [facility];  Service: Gastroenterology;  Laterality: N/A;   IMAGE GUIDED  SINUS SURGERY N/A 12/26/2016   Procedure: IMAGE GUIDED SINUS SURGERY;  Surgeon: Chinita Hasten, MD;  Location: [facility];  Service: ENT;  Laterality: N/A;   MAXILLARY ANTROSTOMY Left 12/26/2016   Procedure: MAXILLARY ANTROSTOMY;  Surgeon: Chinita Hasten, MD;  Location: [facility];  Service: ENT;  Laterality: Left;   SEPTOPLASTY N/A 12/26/2016   Procedure: SEPTOPLASTY;  Surgeon: Chinita Hasten, MD;  Location: [facility];  Service: ENT;  Laterality: N/A;   TURBINATE REDUCTION Bilateral 12/26/2016   Procedure: MONTA REDUCTION;  Surgeon: Chinita Hasten, MD;  Location: [facility];  Service: ENT;  Laterality: Bilateral;    Social History   Socioeconomic History   Marital status: Married    Spouse name: Not on file   Number of children: Not on file   Years of education: Not on file   Highest education level: Not on file  Occupational History   Not on file  Tobacco Use   Smoking status: Never   Smokeless tobacco: Never  Substance and Sexual Activity   Alcohol use: No   Drug use: No   Sexual activity: Not on file  Other Topics Concern   Not on file  Social History Narrative   Not on file   Social Drivers of Health   Tobacco Use: Low Risk (12/30/2024)   Patient History    Smoking Tobacco Use: Never    Smokeless Tobacco Use: Never    Passive Exposure: Not on file  Financial Resource Strain: Not on file  Food Insecurity: Not on file  Transportation Needs: Not on file  Physical Activity: Not on file  Stress: Not on file  Social Connections: Not on file  Intimate Partner Violence: Not on file  Depression (PHQ2-9): High Risk (04/13/2024)   Depression (PHQ2-9)    PHQ-2 Score: 17  Alcohol Screen: Not on file  Housing: Not on file  Utilities: Not on file  Health Literacy: Not on file    Family History  Problem Relation Age of Onset   Heart disease Mother    Diabetes Father    Hypertension Father    Cancer Paternal Grandmother         breast   Breast cancer Paternal Grandmother    Breast cancer Paternal Aunt     Allergies[1]  Review of Systems  All other systems reviewed and are negative.      Objective:   BP 116/80   Pulse 87   Ht 5' 5 (1.651 m)   Wt 200 lb (90.7 kg)   LMP  (LMP Unknown) Comment: pt has an IUD  SpO2 96%   BMI 33.28 kg/m   Vitals:   12/30/24 1357  BP: 116/80  Pulse: 87  Height: 5' 5 (1.651 m)  Weight: 200 lb (90.7 kg)  SpO2: 96%  BMI (Calculated): 33.28    Physical Exam Vitals and nursing note reviewed.  Constitutional:      Appearance: Normal appearance. She is normal weight.  HENT:     Head: Normocephalic.  Eyes:     Extraocular Movements: Extraocular movements intact.     Conjunctiva/sclera: Conjunctivae normal.     Pupils: Pupils are equal, round, and reactive to light.  Cardiovascular:     Rate and Rhythm: Normal rate.  Pulmonary:     Effort: Pulmonary effort is normal.  Neurological:     General: No focal deficit present.     Mental Status: She is alert and oriented to person, place, and time. Mental status is at baseline.  Psychiatric:        Mood and Affect: Mood normal.        Behavior: Behavior normal.        Thought Content: Thought content normal.      No results found for any visits on 12/30/24.  Recent Results (from the past 2160 hours)  POCT Urinalysis Dipstick (18997)     Status: Abnormal   Collection Time: 11/07/24  1:40 PM  Result Value Ref Range   Color, UA Yellow    Clarity, UA Cloudy    Glucose, UA Negative Negative   Bilirubin, UA Negative    Ketones, UA Negative    Spec Grav, UA >=1.030 (A) 1.010 - 1.025   Blood, UA Negative    pH, UA 5.5 5.0 - 8.0   Protein, UA Positive (A) Negative   Urobilinogen, UA 0.2 0.2 or 1.0 E.U./dL   Nitrite, UA Negative    Leukocytes, UA Negative Negative   Appearance Cloudy    Odor Yes   NuSwab Vaginitis Plus (VG+)     Status: Abnormal   Collection Time: 11/07/24  3:40 PM  Result Value Ref Range    Atopobium vaginae High - 2 (A) Score   BVAB 2 High - 2 (A) Score   Megasphaera 1 High - 2 (A) Score    Comment: Calculate total score by adding the 3 individual bacterial vaginosis (BV) marker scores together.  Total score is interpreted as follows: Total score 0-1: Indicates the absence of BV. Total score   2: Indeterminate for BV. Additional clinical  data should be evaluated to establish a                  diagnosis. Total score 3-6: Indicates the presence of BV.    Candida albicans, NAA Negative Negative   Candida glabrata, NAA Negative Negative   Trich vag by NAA Negative Negative   Chlamydia trachomatis, NAA Negative Negative   Neisseria gonorrhoeae, NAA Negative Negative       Assessment & Plan Left hip pain XR ordered today.  Will call with results when available.  Pt instructed to go to [facility] to have done at her earliest convenience.   Class 2 severe obesity due to excess calories with serious comorbidity and body mass index (BMI) of 39.0 to 39.9 in adult Patient stable.  Well controlled with current therapy.   Continue current meds.   Anxiety Pt given samples of Vraylar, she will let me know if these are effective for her or not.  Will send RX if needed.   Mixed hyperlipidemia Checking labs today.  Continue current therapy for lipid control. Will modify as needed based on labwork results.   Essential (primary) hypertension Blood pressure well controlled with current medications.  Continue current therapy.  Will reassess at follow up.   Vitamin D deficiency Will continue supplements as needed.      Return in about 3 months (around 03/30/2025).   Total time spent: 20 minutes  MILEYDI MILSAP, FNP  12/30/2024   This document may have been prepared by [facility] Voice Recognition software and as such may include unintentional dictation errors.     [1] No Known Allergies  "

## 2024-12-31 NOTE — Assessment & Plan Note (Signed)
 Checking labs today.  Continue current therapy for lipid control. Will modify as needed based on labwork results.

## 2024-12-31 NOTE — Assessment & Plan Note (Signed)
 Pt given samples of Vraylar, she will let me know if these are effective for her or not.  Will send RX if needed.

## 2024-12-31 NOTE — Assessment & Plan Note (Signed)
 Blood pressure well controlled with current medications.  Continue current therapy.  Will reassess at follow up.

## 2024-12-31 NOTE — Assessment & Plan Note (Signed)
 Will continue supplements as needed.

## 2024-12-31 NOTE — Assessment & Plan Note (Signed)
 Patient stable.  Well controlled with current therapy.   Continue current meds.

## 2025-01-05 ENCOUNTER — Ambulatory Visit
Admission: RE | Admit: 2025-01-05 | Discharge: 2025-01-05 | Disposition: A | Discharge status: Home / Self Care | Source: Ambulatory Visit | Attending: Family | Admitting: Family

## 2025-01-05 ENCOUNTER — Ambulatory Visit
Admission: RE | Admit: 2025-01-05 | Discharge: 2025-01-05 | Disposition: A | Discharge status: Home / Self Care | Attending: Family | Admitting: Family

## 2025-01-05 DIAGNOSIS — M25552 Pain in left hip: Secondary | ICD-10-CM

## 2025-01-11 ENCOUNTER — Other Ambulatory Visit: Payer: Self-pay | Admitting: Family

## 2025-01-11 MED ORDER — CARIPRAZINE HCL 3 MG PO CAPS: 3.0000 mg | ORAL_CAPSULE | Freq: Every day | ORAL | 1 refills | Status: AC

## 2025-01-19 ENCOUNTER — Ambulatory Visit: Payer: Self-pay
